# Patient Record
Sex: Male | Born: 1950 | Race: White | Hispanic: No | Smoking: Former smoker
Health system: Southern US, Community
[De-identification: ages and names within clinical notes are randomized; demographics above are authoritative.]

## PROBLEM LIST (undated history)

## (undated) DIAGNOSIS — E785 Hyperlipidemia, unspecified: Secondary | ICD-10-CM

## (undated) DIAGNOSIS — R51 Headache: Secondary | ICD-10-CM

## (undated) DIAGNOSIS — Z87891 Personal history of nicotine dependence: Secondary | ICD-10-CM

## (undated) DIAGNOSIS — K219 Gastro-esophageal reflux disease without esophagitis: Secondary | ICD-10-CM

## (undated) DIAGNOSIS — G4700 Insomnia, unspecified: Secondary | ICD-10-CM

## (undated) DIAGNOSIS — R05 Cough: Secondary | ICD-10-CM

## (undated) DIAGNOSIS — R059 Cough, unspecified: Secondary | ICD-10-CM

## (undated) DIAGNOSIS — G0490 Encephalitis and encephalomyelitis, unspecified: Secondary | ICD-10-CM

## (undated) DIAGNOSIS — R519 Headache, unspecified: Secondary | ICD-10-CM

## (undated) DIAGNOSIS — G20 Parkinson's disease: Secondary | ICD-10-CM

## (undated) DIAGNOSIS — G527 Disorders of multiple cranial nerves: Secondary | ICD-10-CM

## (undated) DIAGNOSIS — E559 Vitamin D deficiency, unspecified: Secondary | ICD-10-CM

## (undated) DIAGNOSIS — M797 Fibromyalgia: Secondary | ICD-10-CM

## (undated) DIAGNOSIS — G039 Meningitis, unspecified: Secondary | ICD-10-CM

## (undated) HISTORY — PX: BACK SURGERY: SHX140

## (undated) HISTORY — DX: Disorders of multiple cranial nerves: G52.7

## (undated) HISTORY — PX: NECK SURGERY: SHX720

## (undated) HISTORY — DX: Vitamin D deficiency, unspecified: E55.9

## (undated) HISTORY — DX: Insomnia, unspecified: G47.00

## (undated) HISTORY — DX: Fibromyalgia: M79.7

## (undated) HISTORY — DX: Encephalitis and encephalomyelitis, unspecified: G04.90

## (undated) HISTORY — DX: Cough, unspecified: R05.9

## (undated) HISTORY — DX: Meningitis, unspecified: G03.9

## (undated) HISTORY — DX: Hyperlipidemia, unspecified: E78.5

## (undated) HISTORY — DX: Parkinson's disease: G20

## (undated) HISTORY — DX: Headache, unspecified: R51.9

## (undated) HISTORY — DX: Headache: R51

## (undated) HISTORY — DX: Cough: R05

## (undated) HISTORY — DX: Gastro-esophageal reflux disease without esophagitis: K21.9

## (undated) HISTORY — DX: Personal history of nicotine dependence: Z87.891

---

## 1999-08-18 ENCOUNTER — Encounter: Admission: RE | Admit: 1999-08-18 | Discharge: 1999-08-18 | Payer: Self-pay | Admitting: Neurosurgery

## 1999-08-18 ENCOUNTER — Encounter: Payer: Self-pay | Admitting: Neurosurgery

## 1999-09-09 ENCOUNTER — Inpatient Hospital Stay (HOSPITAL_COMMUNITY): Admission: RE | Admit: 1999-09-09 | Discharge: 1999-09-14 | Payer: Self-pay | Admitting: Neurosurgery

## 1999-09-09 ENCOUNTER — Encounter: Payer: Self-pay | Admitting: Neurosurgery

## 1999-09-23 ENCOUNTER — Encounter: Payer: Self-pay | Admitting: Neurosurgery

## 1999-09-23 ENCOUNTER — Encounter: Admission: RE | Admit: 1999-09-23 | Discharge: 1999-09-23 | Payer: Self-pay | Admitting: Neurosurgery

## 1999-10-05 DIAGNOSIS — G0490 Encephalitis and encephalomyelitis, unspecified: Secondary | ICD-10-CM

## 1999-10-05 DIAGNOSIS — G039 Meningitis, unspecified: Secondary | ICD-10-CM

## 1999-10-05 HISTORY — DX: Meningitis, unspecified: G03.9

## 1999-10-05 HISTORY — DX: Encephalitis and encephalomyelitis, unspecified: G04.90

## 1999-10-06 ENCOUNTER — Encounter: Payer: Self-pay | Admitting: Neurosurgery

## 1999-10-06 ENCOUNTER — Encounter: Admission: RE | Admit: 1999-10-06 | Discharge: 1999-10-06 | Payer: Self-pay | Admitting: Neurosurgery

## 1999-10-26 ENCOUNTER — Encounter: Admission: RE | Admit: 1999-10-26 | Discharge: 1999-10-26 | Payer: Self-pay | Admitting: Neurosurgery

## 1999-10-26 ENCOUNTER — Encounter: Payer: Self-pay | Admitting: Neurosurgery

## 1999-12-08 ENCOUNTER — Encounter: Payer: Self-pay | Admitting: Neurosurgery

## 1999-12-08 ENCOUNTER — Encounter: Admission: RE | Admit: 1999-12-08 | Discharge: 1999-12-08 | Payer: Self-pay | Admitting: Neurosurgery

## 1999-12-17 ENCOUNTER — Encounter: Payer: Self-pay | Admitting: Neurosurgery

## 1999-12-17 ENCOUNTER — Encounter: Admission: RE | Admit: 1999-12-17 | Discharge: 1999-12-17 | Payer: Self-pay | Admitting: Neurosurgery

## 2000-01-04 ENCOUNTER — Encounter: Payer: Self-pay | Admitting: Neurosurgery

## 2000-01-04 ENCOUNTER — Encounter: Admission: RE | Admit: 2000-01-04 | Discharge: 2000-01-04 | Payer: Self-pay | Admitting: Neurosurgery

## 2001-03-12 ENCOUNTER — Encounter: Payer: Self-pay | Admitting: *Deleted

## 2001-03-12 ENCOUNTER — Encounter: Payer: Self-pay | Admitting: Emergency Medicine

## 2001-03-12 ENCOUNTER — Inpatient Hospital Stay (HOSPITAL_COMMUNITY): Admission: EM | Admit: 2001-03-12 | Discharge: 2001-03-21 | Payer: Self-pay | Admitting: Emergency Medicine

## 2001-03-13 ENCOUNTER — Encounter: Payer: Self-pay | Admitting: Neurology

## 2001-03-14 ENCOUNTER — Encounter: Payer: Self-pay | Admitting: Neurology

## 2001-03-17 ENCOUNTER — Encounter: Payer: Self-pay | Admitting: Neurology

## 2001-03-17 ENCOUNTER — Encounter: Admission: RE | Admit: 2001-03-17 | Discharge: 2001-03-17 | Payer: Self-pay | Admitting: Neurology

## 2001-03-20 ENCOUNTER — Encounter: Payer: Self-pay | Admitting: Infectious Diseases

## 2001-04-13 ENCOUNTER — Encounter: Payer: Self-pay | Admitting: Neurology

## 2001-04-13 ENCOUNTER — Inpatient Hospital Stay (HOSPITAL_COMMUNITY): Admission: EM | Admit: 2001-04-13 | Discharge: 2001-04-14 | Payer: Self-pay | Admitting: Emergency Medicine

## 2001-04-24 ENCOUNTER — Encounter: Admission: RE | Admit: 2001-04-24 | Discharge: 2001-04-24 | Payer: Self-pay | Admitting: Neurology

## 2001-04-24 ENCOUNTER — Encounter: Payer: Self-pay | Admitting: Neurology

## 2001-10-25 ENCOUNTER — Encounter: Payer: Self-pay | Admitting: Pulmonary Disease

## 2001-10-25 ENCOUNTER — Ambulatory Visit (HOSPITAL_COMMUNITY): Admission: RE | Admit: 2001-10-25 | Discharge: 2001-10-25 | Payer: Self-pay | Admitting: Pulmonary Disease

## 2003-02-12 ENCOUNTER — Encounter: Admission: RE | Admit: 2003-02-12 | Discharge: 2003-05-13 | Payer: Self-pay | Admitting: Neurology

## 2003-07-04 ENCOUNTER — Encounter: Payer: Self-pay | Admitting: Cardiology

## 2003-07-04 ENCOUNTER — Encounter: Admission: RE | Admit: 2003-07-04 | Discharge: 2003-07-04 | Payer: Self-pay | Admitting: Cardiology

## 2003-07-24 ENCOUNTER — Ambulatory Visit (HOSPITAL_COMMUNITY): Admission: RE | Admit: 2003-07-24 | Discharge: 2003-07-24 | Payer: Self-pay | Admitting: Cardiology

## 2004-05-14 ENCOUNTER — Ambulatory Visit (HOSPITAL_COMMUNITY): Admission: RE | Admit: 2004-05-14 | Discharge: 2004-05-14 | Payer: Self-pay | Admitting: Neurosurgery

## 2004-05-20 ENCOUNTER — Inpatient Hospital Stay (HOSPITAL_COMMUNITY): Admission: RE | Admit: 2004-05-20 | Discharge: 2004-05-22 | Payer: Self-pay | Admitting: Neurosurgery

## 2004-09-17 ENCOUNTER — Ambulatory Visit: Payer: Self-pay | Admitting: Pulmonary Disease

## 2004-10-16 ENCOUNTER — Ambulatory Visit: Payer: Self-pay | Admitting: Pulmonary Disease

## 2005-01-20 ENCOUNTER — Encounter: Admission: RE | Admit: 2005-01-20 | Discharge: 2005-01-20 | Payer: Self-pay | Admitting: Emergency Medicine

## 2006-02-11 ENCOUNTER — Ambulatory Visit (HOSPITAL_BASED_OUTPATIENT_CLINIC_OR_DEPARTMENT_OTHER): Admission: RE | Admit: 2006-02-11 | Discharge: 2006-02-11 | Payer: Self-pay | Admitting: Urology

## 2011-01-15 ENCOUNTER — Other Ambulatory Visit: Payer: Self-pay | Admitting: Family Medicine

## 2011-01-15 ENCOUNTER — Ambulatory Visit
Admission: RE | Admit: 2011-01-15 | Discharge: 2011-01-15 | Disposition: A | Discharge status: Home / Self Care | Payer: BLUE CROSS/BLUE SHIELD | Source: Ambulatory Visit | Attending: Family Medicine | Admitting: Family Medicine

## 2011-01-15 DIAGNOSIS — R042 Hemoptysis: Secondary | ICD-10-CM

## 2011-01-15 DIAGNOSIS — R0602 Shortness of breath: Secondary | ICD-10-CM

## 2011-01-19 ENCOUNTER — Ambulatory Visit
Admission: RE | Admit: 2011-01-19 | Discharge: 2011-01-19 | Disposition: A | Discharge status: Home / Self Care | Payer: BLUE CROSS/BLUE SHIELD | Source: Ambulatory Visit | Attending: Family Medicine | Admitting: Family Medicine

## 2011-01-19 DIAGNOSIS — R0602 Shortness of breath: Secondary | ICD-10-CM

## 2011-01-19 MED ORDER — IOHEXOL 300 MG/ML  SOLN
75.0000 mL | Freq: Once | INTRAMUSCULAR | Status: AC | PRN
  Administered 2011-01-19: 75 mL via INTRAVENOUS

## 2011-01-22 ENCOUNTER — Other Ambulatory Visit: Payer: BLUE CROSS/BLUE SHIELD

## 2011-02-17 ENCOUNTER — Ambulatory Visit (INDEPENDENT_AMBULATORY_CARE_PROVIDER_SITE_OTHER): Payer: BC Managed Care – PPO | Admitting: Critical Care Medicine

## 2011-02-17 ENCOUNTER — Encounter: Payer: Self-pay | Admitting: Critical Care Medicine

## 2011-02-17 VITALS — BP 128/80 | HR 103 | Temp 98.1°F | Ht 67.0 in | Wt 224.0 lb

## 2011-02-17 DIAGNOSIS — R042 Hemoptysis: Secondary | ICD-10-CM

## 2011-02-17 DIAGNOSIS — R0602 Shortness of breath: Secondary | ICD-10-CM

## 2011-02-17 NOTE — Patient Instructions (Signed)
A CT chest with contrast to evaluate for pulmonary embolism will be obtained.   If this is normal we will need to do a bronchoscopy next week I will call with results

## 2011-02-17 NOTE — Progress Notes (Deleted)
Subjective:     Patient ID: Ronald Delgado, male   DOB: 1951/03/20, 60 y.o.   MRN: [id number]  HPI   Review of Systems  Constitutional: Positive for activity change and fatigue. Negative for fever, chills, diaphoresis, appetite change and unexpected weight change.  HENT: Positive for neck stiffness, postnasal drip and sinus pressure. Negative for hearing loss, ear pain, nosebleeds, congestion, sore throat, facial swelling, rhinorrhea, sneezing, mouth sores, trouble swallowing, neck pain, dental problem, voice change, tinnitus and ear discharge.   Eyes: Negative for photophobia, discharge, itching and visual disturbance.  Respiratory: Positive for cough, shortness of breath and wheezing. Negative for apnea, choking, chest tightness and stridor.   Cardiovascular: Positive for chest pain and palpitations. Negative for leg swelling.  Gastrointestinal: Negative for nausea, vomiting, abdominal pain, constipation, blood in stool and abdominal distention.  Genitourinary: Positive for frequency. Negative for dysuria, urgency, hematuria, flank pain, decreased urine volume and difficulty urinating.  Musculoskeletal: Positive for gait problem. Negative for myalgias, back pain, joint swelling and arthralgias.  Skin: Negative for color change, pallor and rash.  Neurological: Positive for tremors, speech difficulty, weakness and headaches. Negative for dizziness, seizures, syncope, light-headedness and numbness.  Hematological: Negative for adenopathy. Does not bruise/bleed easily.  Psychiatric/Behavioral: Negative for confusion, sleep disturbance and agitation. The patient is not nervous/anxious.        Objective:   Physical Exam     Assessment:     ***    Plan:     ***

## 2011-02-17 NOTE — Progress Notes (Signed)
Subjective:    Patient ID: Ronald Delgado, male    DOB: Feb 18, 1951, 60 y.o.   MRN: [id number] 60 yo WM referred for the following symptoms: Shortness of Breath The current episode started more than 1 month ago. The problem occurs daily (dyspnea on exertion 195ft, worse up incline, occasionally at rest ). The problem has been unchanged. Associated symptoms include hemoptysis, neck pain, rhinorrhea, sputum production and wheezing. Pertinent negatives include no chest pain, claudication, ear pain, fever, headaches, leg pain, leg swelling, orthopnea, PND, rash, sore throat, syncope or vomiting. The symptoms are aggravated by any activity. There is no history of asthma, a heart failure, PE or pneumonia.  Cough This is a new problem. The current episode started more than 1 month ago. The problem has been gradually worsening. The problem occurs every few minutes. The cough is productive of sputum and productive of purulent sputum. Associated symptoms include hemoptysis, postnasal drip, rhinorrhea, shortness of breath and wheezing. Pertinent negatives include no chest pain, chills, ear congestion, ear pain, fever, headaches, myalgias, nasal congestion, rash, sore throat or sweats. Associated symptoms comments: Notes sharp pain LUE lasts a few seconds.  Neck is stiff. There is no history of asthma or pneumonia.    6weeks ago began getting dyspnea, like air forced out of lungs, then hemoptysis.  Pure blood.  Not large amounts, 6 weeks ago, lasted one week. Then resolved.  No real energy issues.  No real chest pain.  Still with dyspnea .     Past Medical History  Diagnosis Date  . Hyperlipidemia   . Chronic headache   . Meningitis 2001    viral  . 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 GERD (gastroesophageal reflux disease)   . Cough      Family History  Problem Relation Age of Onset  . Heart disease Mother   . Breast cancer Mother      History   Social History  . Marital Status: Married   Spouse Name: N/A    Number of Children: 0  . 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 Smoking status: Former Smoker -- 1.0 packs/day for 32 years    Types: Cigarettes    Quit date: 08/16/2000  . Smokeless tobacco: Never Used  . Alcohol Use: No  . Drug Use: No  . Sexually Active: Not on file   Other Topics Concern  . Not on file   Social History Narrative  . No narrative on file     No Known Allergies   No outpatient prescriptions prior to visit.     Review of Systems  Constitutional: Negative for fever and chills.  HENT: Positive for rhinorrhea, neck pain and postnasal drip. Negative for ear pain and sore throat.   Respiratory: Positive for cough, hemoptysis, sputum production, shortness of breath and wheezing.   Cardiovascular: Negative for chest pain, orthopnea, claudication, leg swelling, syncope and PND.  Gastrointestinal: Negative for vomiting.  Musculoskeletal: Negative for myalgias.  Skin: Negative for rash.  Neurological: Negative for headaches.       Objective:   Physical Exam Filed Vitals:   02/17/11 1506  BP: 128/80  Pulse: 103  Temp: 98.1 F (36.7 C)  TempSrc: Oral  Height: 5\' 7"  (1.702 m)  Weight: 224 lb (101.606 kg)  SpO2: 95%    Gen: Pleasant, obese , in no distress,  normal affect  ENT: No lesions,  mouth  clear,  oropharynx clear, no postnasal drip  Neck: No JVD, no TMG, no carotid bruits  Lungs: No use of accessory muscles, no dullness to percussion, clear without rales or rhonchi  Cardiovascular: RRR, heart sounds normal, no murmur or gallops, no peripheral edema  Abdomen: soft and NT, no HSM,  BS normal  Musculoskeletal: No deformities, no cyanosis or clubbing  Neuro: alert, non focal  Skin: Warm, no lesions or rashes     Labs 01/15/11: BNP: <5;  INR 0.98   LFTs normal,  WBC 7.3  Hgb 16  Cr 1.10  CT Chest 4/17: Scarring in LLL,  No infiltrate, no bronchiectasis  or masses  Echo 01/26/11: NL LVEF,  Mild LVH  EF 74% No pulm HTN  Assessment & Plan:   Hemoptysis Hemoptysis with associated dyspnea of ? Etiology. Orig CT chest was not an angio study. Pfts not c/w obstructive lung disease. FeV1 86% Plan CTangio of chest PE protocol If this study is negative, plan FOB No med changes for now    Updated Medication List Outpatient Encounter Prescriptions as of 02/17/2011  Medication Sig Dispense Refill  . famotidine (PEPCID) 20 MG tablet Take 20 mg by mouth 2 (two) times daily.        Marland Kitchen HYDROcodone-acetaminophen (NORCO) 5-325 MG per tablet As needed       . nortriptyline (PAMELOR) 50 MG capsule Take 1 capsule by mouth Daily.      . pregabalin (LYRICA) 75 MG capsule 1 tablet in the morning and 2 tablets in the pm

## 2011-02-18 ENCOUNTER — Ambulatory Visit (INDEPENDENT_AMBULATORY_CARE_PROVIDER_SITE_OTHER)
Admission: RE | Admit: 2011-02-18 | Discharge: 2011-02-18 | Disposition: A | Discharge status: Home / Self Care | Payer: BC Managed Care – PPO | Source: Ambulatory Visit | Attending: Critical Care Medicine | Admitting: Critical Care Medicine

## 2011-02-18 DIAGNOSIS — R0602 Shortness of breath: Secondary | ICD-10-CM

## 2011-02-18 DIAGNOSIS — R042 Hemoptysis: Secondary | ICD-10-CM | POA: Insufficient documentation

## 2011-02-18 MED ORDER — IOHEXOL 300 MG/ML  SOLN
100.0000 mL | Freq: Once | INTRAMUSCULAR | Status: AC | PRN
  Administered 2011-02-18: 100 mL via INTRAVENOUS

## 2011-02-18 NOTE — Assessment & Plan Note (Signed)
Hemoptysis with associated dyspnea of ? Etiology. Orig CT chest was not an angio study. Pfts not c/w obstructive lung disease. FeV1 86% Plan CTangio of chest PE protocol If this study is negative, plan FOB No med changes for now

## 2011-02-24 ENCOUNTER — Encounter: Payer: Self-pay | Admitting: Critical Care Medicine

## 2011-02-24 ENCOUNTER — Other Ambulatory Visit: Payer: Self-pay | Admitting: Critical Care Medicine

## 2011-02-24 ENCOUNTER — Ambulatory Visit (HOSPITAL_COMMUNITY)
Admission: RE | Admit: 2011-02-24 | Discharge: 2011-02-24 | Disposition: A | Discharge status: Home / Self Care | Payer: BC Managed Care – PPO | Source: Ambulatory Visit | Attending: Critical Care Medicine | Admitting: Critical Care Medicine

## 2011-02-24 DIAGNOSIS — J40 Bronchitis, not specified as acute or chronic: Secondary | ICD-10-CM

## 2011-02-24 DIAGNOSIS — R042 Hemoptysis: Secondary | ICD-10-CM

## 2011-02-25 NOTE — Op Note (Signed)
  NAME:  Ronald Delgado, Ronald Delgado            ACCOUNT NO.:  [id number]  MEDICAL RECORD NO.:  [id number]           PATIENT TYPE:  O  LOCATION:  RESPT                        FACILITY:  [facility]  PHYSICIAN:  Charlcie Cradle. Delford Field, MD, FCCPDATE OF BIRTH:  [birth year]  DATE OF PROCEDURE:  02/24/2011 DATE OF DISCHARGE:                              OPERATIVE REPORT   INDICATION:  Hemoptysis.  OPERATOR:  Charlcie Cradle. Delford Field, MD, FCCP  ANESTHESIA:  1% Xylocaine local.  PREMEDICATION: 1. Fentanyl 50 mcg. 2. Versed 5 mg IV push.  PROCEDURE:  The Pentax video bronchoscope was introduced via the right naris.  The upper airways were visualized and unremarkable.  The entire tracheobronchial tree was visualized, revealed diffuse tracheobronchitis with minimal secretions.  No endobronchial lesions were seen.  The right middle lobe was then inspected, showed more prominent secretions in other airways.  This was washed.  No biopsies were taken.  COMPLICATIONS:  None.  IMPRESSION:  Hemoptysis due to tracheobronchitis without endobronchial lesion.  RECOMMENDATION:  Give consideration to inhaled corticosteroids and follow up microbiologic data.     Charlcie Cradle Delford Field, MD, [facility]     PEW/MEDQ  D:  02/24/2011  T:  02/24/2011  Job:  [id number]  Electronically Signed by Shan Levans MD FCCP on 02/25/2011 08:51:09 AM

## 2011-02-26 ENCOUNTER — Telehealth: Payer: Self-pay | Admitting: Critical Care Medicine

## 2011-02-26 DIAGNOSIS — J441 Chronic obstructive pulmonary disease with (acute) exacerbation: Secondary | ICD-10-CM

## 2011-02-26 MED ORDER — BUDESONIDE-FORMOTEROL FUMARATE 160-4.5 MCG/ACT IN AERO: 2.0000 | INHALATION_SPRAY | Freq: Two times a day (BID) | RESPIRATORY_TRACT | Status: DC

## 2011-02-26 NOTE — Telephone Encounter (Signed)
I tried to call the pt at home/work/cell:  No answer I left msg on cell for pt to call back and ask for triage. Pt needs an Rx for symbicort which I did send to his pharmacy pls let him know this when he calls back although I left this info on his phone  His bronch showed inflammation only. Cultures neg, No CA He needs to make another OV soon at [facility] office

## 2011-02-27 LAB — CULTURE, RESPIRATORY W GRAM STAIN

## 2011-03-02 ENCOUNTER — Telehealth: Payer: Self-pay | Admitting: Critical Care Medicine

## 2011-03-02 NOTE — Telephone Encounter (Signed)
See previous phone note. [name], CMA

## 2011-03-02 NOTE — Telephone Encounter (Signed)
Pt called back and states he received PW call and he has an appt to see PW tomorrow. [name], CMA

## 2011-03-03 ENCOUNTER — Ambulatory Visit (INDEPENDENT_AMBULATORY_CARE_PROVIDER_SITE_OTHER): Payer: BC Managed Care – PPO | Admitting: Critical Care Medicine

## 2011-03-03 ENCOUNTER — Encounter: Payer: Self-pay | Admitting: Critical Care Medicine

## 2011-03-03 VITALS — BP 124/88 | HR 99 | Temp 98.6°F | Ht 67.0 in | Wt 220.4 lb

## 2011-03-03 DIAGNOSIS — J42 Unspecified chronic bronchitis: Secondary | ICD-10-CM

## 2011-03-03 DIAGNOSIS — R042 Hemoptysis: Secondary | ICD-10-CM

## 2011-03-03 NOTE — Progress Notes (Signed)
Subjective:    Patient ID: Ronald Delgado, male    DOB: Feb 19, 1951, 60 y.o.   MRN: [id number] 60 yo WM referred for the following symptoms: Shortness of Breath The current episode started more than 1 month ago. The problem occurs daily (dyspnea on exertion 185ft, worse up incline, occasionally at rest ). The problem has been unchanged. Associated symptoms include neck pain, rhinorrhea, sputum production and wheezing. Pertinent negatives include no chest pain, claudication, ear pain, fever, headaches, hemoptysis, leg pain, leg swelling, orthopnea, PND, rash, sore throat, syncope or vomiting. The symptoms are aggravated by any activity. There is no history of asthma, a heart failure, PE or pneumonia.  Cough This is a new problem. The current episode started more than 1 month ago. The problem has been gradually worsening. The problem occurs every few minutes. The cough is productive of sputum and productive of purulent sputum. Associated symptoms include postnasal drip, rhinorrhea, shortness of breath and wheezing. Pertinent negatives include no chest pain, chills, ear congestion, ear pain, fever, headaches, hemoptysis, myalgias, nasal congestion, rash, sore throat or sweats. Associated symptoms comments: Notes sharp pain LUE lasts a few seconds.  Neck is stiff. There is no history of asthma or pneumonia.    6weeks ago began getting dyspnea, like air forced out of lungs, then hemoptysis.  Pure blood.  Not large amounts, 6 weeks ago, lasted one week. Then resolved.  No real energy issues.  No real chest pain.  Still with dyspnea .     03/04/2011 Pt had Fob.  Acute inflammation seen.  No endobronch lesions seen.  All micro neg.  CT Pe study neg.   Pt still dyspneic and coughing. Past Medical History  Diagnosis Date  . Hyperlipidemia   . Chronic headache   . Meningitis 2001    viral  . 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 GERD (gastroesophageal reflux disease)   . Cough      Family History    Problem Relation Age of Onset  . Heart disease Mother   . Breast cancer Mother      History   Social History  . Marital Status: Married    Spouse Name: N/A    Number of Children: 0  . 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 Smoking status: Former Smoker -- 1.0 packs/day for 42 years    Types: Cigarettes    Quit date: 08/16/2000  . Smokeless tobacco: Never Used  . Alcohol Use: No  . Drug Use: No  . Sexually Active: Not on file   Other Topics Concern  . Not on file   Social History Narrative  . No narrative on file     No Known Allergies   Outpatient Prescriptions Prior to Visit  Medication Sig Dispense Refill  . budesonide-formoterol (SYMBICORT) 160-4.5 MCG/ACT inhaler Inhale 2 puffs into the lungs 2 (two) times daily.  1 Inhaler  6  . famotidine (PEPCID) 20 MG tablet Take 20 mg by mouth 2 (two) times daily.        Marland Kitchen HYDROcodone-acetaminophen (NORCO) 5-325 MG per tablet As needed       . nortriptyline (PAMELOR) 50 MG capsule Take 1 capsule by mouth Daily.      . pregabalin (LYRICA) 75 MG capsule 1 tablet in the morning and 2 tablets in the pm          Review of Systems  Constitutional: Negative  for fever and chills.  HENT: Positive for rhinorrhea, neck pain and postnasal drip. Negative for ear pain and sore throat.   Respiratory: Positive for cough, sputum production, shortness of breath and wheezing. Negative for hemoptysis.   Cardiovascular: Negative for chest pain, orthopnea, claudication, leg swelling, syncope and PND.  Gastrointestinal: Negative for vomiting.  Musculoskeletal: Negative for myalgias.  Skin: Negative for rash.  Neurological: Negative for headaches.       Objective:   Physical Exam  Filed Vitals:   03/03/11 1403  BP: 124/88  Pulse: 99  Temp: 98.6 F (37 C)  TempSrc: Oral  Height: 5\' 7"  (1.702 m)  Weight: 220 lb 6.4 oz (99.973 kg)  SpO2: 92%    Gen: Pleasant, obese  , in no distress,  normal affect  ENT: No lesions,  mouth clear,  oropharynx clear, no postnasal drip  Neck: No JVD, no TMG, no carotid bruits  Lungs: No use of accessory muscles, no dullness to percussion, clear without rales or rhonchi  Cardiovascular: RRR, heart sounds normal, no murmur or gallops, no peripheral edema  Abdomen: soft and NT, no HSM,  BS normal  Musculoskeletal: No deformities, no cyanosis or clubbing  Neuro: alert, non focal  Skin: Warm, no lesions or rashes     Labs 01/15/11: BNP: <5;  INR 0.98   LFTs normal,  WBC 7.3  Hgb 16  Cr 1.10  CT Chest 4/17: Scarring in LLL,  No infiltrate, no bronchiectasis or masses  Echo 01/26/11: NL LVEF,  Mild LVH  EF 74% No pulm HTN  Assessment & Plan:   Bronchitis, chronic Hemoptysis d/t chronic bronchitis, spiro without significant airway obstruction but pt remains dyspneic.  No PE seen on CTA of chest.  All c/s data neg from bronch,  No CA on Bronch Plan Start symbicort two puff bid 160    Hemoptysis See bronchitis assessment Hemoptysis has resolved spontaneously     Updated Medication List Outpatient Encounter Prescriptions as of 03/03/2011  Medication Sig Dispense Refill  . budesonide-formoterol (SYMBICORT) 160-4.5 MCG/ACT inhaler Inhale 2 puffs into the lungs 2 (two) times daily.  1 Inhaler  6  . famotidine (PEPCID) 20 MG tablet Take 20 mg by mouth 2 (two) times daily.        Marland Kitchen HYDROcodone-acetaminophen (NORCO) 5-325 MG per tablet As needed       . nortriptyline (PAMELOR) 50 MG capsule Take 1 capsule by mouth Daily.      . pregabalin (LYRICA) 75 MG capsule 1 tablet in the morning and 2 tablets in the pm

## 2011-03-03 NOTE — Patient Instructions (Signed)
Use symbicort two puff twice daily No other med changes Return 3 months

## 2011-03-04 NOTE — Assessment & Plan Note (Signed)
Hemoptysis d/t chronic bronchitis, spiro without significant airway obstruction but pt remains dyspneic.  No PE seen on CTA of chest.  All c/s data neg from bronch,  No CA on Bronch Plan Start symbicort two puff bid 160

## 2011-03-04 NOTE — Assessment & Plan Note (Signed)
See bronchitis assessment Hemoptysis has resolved spontaneously

## 2011-03-24 LAB — FUNGUS CULTURE W SMEAR

## 2011-04-08 LAB — AFB CULTURE WITH SMEAR (NOT AT ARMC)

## 2011-08-03 ENCOUNTER — Encounter: Payer: Self-pay | Admitting: Critical Care Medicine

## 2011-08-03 ENCOUNTER — Ambulatory Visit (INDEPENDENT_AMBULATORY_CARE_PROVIDER_SITE_OTHER): Payer: BC Managed Care – PPO | Admitting: Critical Care Medicine

## 2011-08-03 VITALS — BP 140/88 | HR 113 | Temp 98.6°F | Ht 66.5 in | Wt 228.2 lb

## 2011-08-03 DIAGNOSIS — R042 Hemoptysis: Secondary | ICD-10-CM

## 2011-08-03 DIAGNOSIS — Z23 Encounter for immunization: Secondary | ICD-10-CM

## 2011-08-03 DIAGNOSIS — J42 Unspecified chronic bronchitis: Secondary | ICD-10-CM

## 2011-08-03 MED ORDER — PREDNISONE 10 MG PO TABS: ORAL_TABLET | ORAL | Status: DC

## 2011-08-03 MED ORDER — AZITHROMYCIN 250 MG PO TABS: ORAL_TABLET | ORAL | Status: DC

## 2011-08-03 NOTE — Patient Instructions (Signed)
Start prednisone 10mg  Take 4 for two days three for two days two for two days one for two days Azithromycin 250mg  Take two once then one daily until gone Stay on symbicort Flu vaccine and pneumovax today Return 6 weeks recheck

## 2011-08-03 NOTE — Progress Notes (Signed)
Subjective:    Patient ID: Ronald Delgado, male    DOB: [birth year], 60 y.o.   MRN: [id number] 60 yo WM referred for the following symptoms: HPI  6weeks ago began getting dyspnea, like air forced out of lungs, then hemoptysis.  Pure blood.  Not large amounts, 6 weeks ago, lasted one week. Then resolved.  No real energy issues.  No real chest pain.  Still with dyspnea .     03/03/11 Pt had Fob.  Acute inflammation seen.  No endobronch lesions seen.  All micro neg.  CT Pe study neg.   Pt still dyspneic and coughing.  10/30 Noting more difficulty for past 6weeks.  Notes more dyspnea.  Mucus is sl bloody, also sl green to brown.    Past Medical History  Diagnosis Date  . Hyperlipidemia   . Chronic headache   . Meningitis 2001    viral  . 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 GERD (gastroesophageal reflux disease)   . Cough      Family History  Problem Relation Age of Onset  . Heart disease Mother   . Breast cancer Mother      History   Social History  . Marital Status: Married    Spouse Name: N/A    Number of Children: 0  . 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 Smoking status: Former Smoker -- 1.0 packs/day for 42 years    Types: Cigarettes    Quit date: 08/16/2000  . Smokeless tobacco: Never Used  . Alcohol Use: No  . Drug Use: No  . Sexually Active: Not on file   Other Topics Concern  . Not on file   Social History Narrative  . No narrative on file     No Known Allergies   Outpatient Prescriptions Prior to Visit  Medication Sig Dispense Refill  . budesonide-formoterol (SYMBICORT) 160-4.5 MCG/ACT inhaler Inhale 2 puffs into the lungs 2 (two) times daily.  1 Inhaler  6  . famotidine (PEPCID) 20 MG tablet Take 20 mg by mouth 2 (two) times daily.        Marland Kitchen HYDROcodone-acetaminophen (NORCO) 5-325 MG per tablet As needed       . nortriptyline (PAMELOR) 50 MG capsule Take 1 capsule by mouth Daily.       . pregabalin (LYRICA) 75 MG capsule 1 tablet in the morning and 2 tablets in the pm          Review of Systems Constitutional:   No  weight loss, night sweats,  Fevers, chills, fatigue, lassitude. HEENT:   No headaches,  Difficulty swallowing,  Tooth/dental problems,  Sore throat,                No sneezing, itching, ear ache, nasal congestion, post nasal drip,   CV:  No chest pain,  Orthopnea, PND, swelling in lower extremities, anasarca, dizziness, palpitations  GI  No heartburn, indigestion, abdominal pain, nausea, vomiting, diarrhea, change in bowel habits, loss of appetite  Resp: Notes  shortness of breath with exertion or at rest.  Notes  excess mucus, notes productive cough,  No non-productive cough,  No coughing up of blood.  Notes  change in color of mucus.  No wheezing.  No chest wall deformity  Skin: no rash or lesions.  GU: no dysuria, change in color of urine, no urgency or frequency.  No flank pain.  MS:  No joint pain or swelling.  No decreased range of motion.  No back pain.  Psych:  No change in mood or affect. No depression or anxiety.  No memory loss.     Objective:   Physical Exam  Filed Vitals:   08/03/11 1624  BP: 140/88  Pulse: 113  Temp: 98.6 F (37 C)  TempSrc: Oral  Height: 5' 6.5" (1.689 m)  Weight: 228 lb 3.2 oz (103.511 kg)  SpO2: 91%    Gen: Pleasant, obese , in no distress,  normal affect  ENT: No lesions,  mouth clear,  oropharynx clear, no postnasal drip  Neck: No JVD, no TMG, no carotid bruits  Lungs: No use of accessory muscles, no dullness to percussion, exp wheeze, coarse BS  Cardiovascular: RRR, heart sounds normal, no murmur or gallops, no peripheral edema  Abdomen: soft and NT, no HSM,  BS normal  Musculoskeletal: No deformities, no cyanosis or clubbing  Neuro: alert, non focal  Skin: Warm, no lesions or rashes     Labs 01/15/11: BNP: <5;  INR 0.98   LFTs normal,  WBC 7.3  Hgb 16  Cr 1.10  CT Chest 4/17: Scarring  in LLL,  No infiltrate, no bronchiectasis or masses  Echo 01/26/11: NL LVEF,  Mild LVH  EF 74% No pulm HTN  Assessment & Plan:   Hemoptysis Recurrent hemoptysis on the basis of acute tracheobronchitis, no evidence for malignancy Plan Azithromycin for 5 days Prednisone taper  Bronchitis, chronic Reviewed hemoptysis problem Plan will be to administer azithromycin and prednisone     Updated Medication List Outpatient Encounter Prescriptions as of 08/03/2011  Medication Sig Dispense Refill  . budesonide-formoterol (SYMBICORT) 160-4.5 MCG/ACT inhaler Inhale 2 puffs into the lungs 2 (two) times daily.  1 Inhaler  6  . famotidine (PEPCID) 20 MG tablet Take 20 mg by mouth 2 (two) times daily.        Marland Kitchen HYDROcodone-acetaminophen (NORCO) 5-325 MG per tablet As needed       . nortriptyline (PAMELOR) 50 MG capsule Take 1 capsule by mouth Daily.      . pregabalin (LYRICA) 75 MG capsule 1 tablet in the morning and 2 tablets in the pm       . azithromycin (ZITHROMAX) 250 MG tablet Take two once then one daily until gone  6 each  0  . predniSONE (DELTASONE) 10 MG tablet Take 4 for two days three for two days two for two days one for two days  20 tablet  0

## 2011-08-04 NOTE — Assessment & Plan Note (Addendum)
Recurrent hemoptysis on the basis of acute tracheobronchitis, no evidence for malignancy Plan Azithromycin for 5 days Prednisone taper

## 2011-08-04 NOTE — Assessment & Plan Note (Signed)
Reviewed hemoptysis problem Plan will be to administer azithromycin and prednisone

## 2011-09-14 ENCOUNTER — Encounter: Payer: Self-pay | Admitting: Critical Care Medicine

## 2011-09-14 ENCOUNTER — Ambulatory Visit (INDEPENDENT_AMBULATORY_CARE_PROVIDER_SITE_OTHER): Payer: BC Managed Care – PPO | Admitting: Critical Care Medicine

## 2011-09-14 VITALS — BP 146/92 | HR 99 | Temp 97.9°F | Ht 67.0 in | Wt 227.4 lb

## 2011-09-14 DIAGNOSIS — J42 Unspecified chronic bronchitis: Secondary | ICD-10-CM

## 2011-09-14 DIAGNOSIS — R05 Cough: Secondary | ICD-10-CM

## 2011-09-14 MED ORDER — MOXIFLOXACIN HCL 400 MG PO TABS: 400.0000 mg | ORAL_TABLET | Freq: Every day | ORAL | Status: AC

## 2011-09-14 NOTE — Patient Instructions (Signed)
Take avelox one daily for 5days, use sample, 4 sent to pharmacy Stay on symbicort , inhaler more slowly Return 3 months, sooner if not improving

## 2011-09-14 NOTE — Assessment & Plan Note (Signed)
Recurrent tracheobronchitis and purulent airway secretions Plan avelox x 5days Slow inhaler technique down , stay on symbicort two puff bid

## 2011-09-14 NOTE — Progress Notes (Signed)
Subjective:    Patient ID: Ronald Delgado, male    DOB: [birth year], 60 y.o.   MRN: [id number] 60 yo WM referred for the following symptoms: HPI   6weeks ago began getting dyspnea, like air forced out of lungs, then hemoptysis.  Pure blood.  Not large amounts, 6 weeks ago, lasted one week. Then resolved.  No real energy issues.  No real chest pain.  Still with dyspnea .     03/03/11 Pt had Fob.  Acute inflammation seen.  No endobronch lesions seen.  All micro neg.  CT Pe study neg.   Pt still dyspneic and coughing.  10/30 Noting more difficulty for past 6weeks.  Notes more dyspnea.  Mucus is sl bloody, also sl green to brown.  09/14/2011 At last ov we rec: Azithromycin and pred pulse Not as much volume.  Still brown green.  No real sinus issues.  Notes dyspnea with exertion. By 230pm is wiped out.   Past Medical History  Diagnosis Date  . Hyperlipidemia   . Chronic headache   . Meningitis 2001    viral  . 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 GERD (gastroesophageal reflux disease)   . Cough      Family History  Problem Relation Age of Onset  . Heart disease Mother   . Breast cancer Mother      History   Social History  . Marital Status: Married    Spouse Name: N/A    Number of Children: 0  . 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 Smoking status: Former Smoker -- 1.0 packs/day for 42 years    Types: Cigarettes    Quit date: 08/16/2000  . Smokeless tobacco: Never Used  . Alcohol Use: No  . Drug Use: No  . Sexually Active: Not on file   Other Topics Concern  . Not on file   Social History Narrative  . No narrative on file     No Known Allergies   Outpatient Prescriptions Prior to Visit  Medication Sig Dispense Refill  . budesonide-formoterol (SYMBICORT) 160-4.5 MCG/ACT inhaler Inhale 2 puffs into the lungs 2 (two) times daily.  1 Inhaler  6  . famotidine (PEPCID) 20 MG tablet Take 20 mg by  mouth 2 (two) times daily.        Marland Kitchen HYDROcodone-acetaminophen (NORCO) 5-325 MG per tablet As needed       . nortriptyline (PAMELOR) 50 MG capsule Take 1 capsule by mouth Daily.      . pregabalin (LYRICA) 75 MG capsule 1 tablet in the morning and 2 tablets in the pm       . azithromycin (ZITHROMAX) 250 MG tablet Take two once then one daily until gone  6 each  0  . predniSONE (DELTASONE) 10 MG tablet Take 4 for two days three for two days two for two days one for two days  20 tablet  0     Review of Systems  Constitutional:   No  weight loss, night sweats,  Fevers, chills, fatigue, lassitude. HEENT:   No headaches,  Difficulty swallowing,  Tooth/dental problems,  Sore throat,                No sneezing, itching, ear ache, nasal congestion, post nasal drip,   CV:  No chest pain,  Orthopnea, PND, swelling in lower extremities, anasarca, dizziness, palpitations  GI  No heartburn, indigestion,  abdominal pain, nausea, vomiting, diarrhea, change in bowel habits, loss of appetite  Resp: Notes  shortness of breath with exertion or at rest.  Notes  excess mucus, notes productive cough,  No non-productive cough,  No coughing up of blood.  Notes  change in color of mucus.  No wheezing.  No chest wall deformity  Skin: no rash or lesions.  GU: no dysuria, change in color of urine, no urgency or frequency.  No flank pain.  MS:  No joint pain or swelling.  No decreased range of motion.  No back pain.  Psych:  No change in mood or affect. No depression or anxiety.  No memory loss.     Objective:   Physical Exam  Filed Vitals:   09/14/11 1632  BP: 146/92  Pulse: 99  Temp: 97.9 F (36.6 C)  TempSrc: Oral  Height: 5\' 7"  (1.702 m)  Weight: 227 lb 6.4 oz (103.148 kg)  SpO2: 94%    Gen: Pleasant, obese , in no distress,  normal affect  ENT: No lesions,  mouth clear,  oropharynx clear, no postnasal drip  Neck: No JVD, no TMG, no carotid bruits  Lungs: No use of accessory muscles, no  dullness to percussion, exp wheeze, coarse BS  Cardiovascular: RRR, heart sounds normal, no murmur or gallops, no peripheral edema  Abdomen: soft and NT, no HSM,  BS normal  Musculoskeletal: No deformities, no cyanosis or clubbing  Neuro: alert, non focal  Skin: Warm, no lesions or rashes     Labs 01/15/11: BNP: <5;  INR 0.98   LFTs normal,  WBC 7.3  Hgb 16  Cr 1.10  CT Chest 4/17: Scarring in LLL,  No infiltrate, no bronchiectasis or masses  Echo 01/26/11: NL LVEF,  Mild LVH  EF 74% No pulm HTN   09/14/11 spiro: Normal  Assessment & Plan:   Bronchitis, chronic Recurrent tracheobronchitis and purulent airway secretions Plan avelox x 5days Slow inhaler technique down , stay on symbicort two puff bid      Updated Medication List Outpatient Encounter Prescriptions as of 09/14/2011  Medication Sig Dispense Refill  . budesonide-formoterol (SYMBICORT) 160-4.5 MCG/ACT inhaler Inhale 2 puffs into the lungs 2 (two) times daily.  1 Inhaler  6  . famotidine (PEPCID) 20 MG tablet Take 20 mg by mouth 2 (two) times daily.        Marland Kitchen HYDROcodone-acetaminophen (NORCO) 5-325 MG per tablet As needed       . nortriptyline (PAMELOR) 50 MG capsule Take 1 capsule by mouth Daily.      . pregabalin (LYRICA) 75 MG capsule 1 tablet in the morning and 2 tablets in the pm       . moxifloxacin (AVELOX) 400 MG tablet Take 1 tablet (400 mg total) by mouth daily.  4 tablet  0  . DISCONTD: azithromycin (ZITHROMAX) 250 MG tablet Take two once then one daily until gone  6 each  0  . DISCONTD: predniSONE (DELTASONE) 10 MG tablet Take 4 for two days three for two days two for two days one for two days  20 tablet  0

## 2011-10-05 DIAGNOSIS — G20C Parkinsonism, unspecified: Secondary | ICD-10-CM

## 2011-10-05 DIAGNOSIS — G20 Parkinson's disease: Secondary | ICD-10-CM

## 2011-10-05 HISTORY — DX: Parkinsonism, unspecified: G20.C

## 2011-10-05 HISTORY — DX: Parkinson's disease: G20

## 2011-11-23 IMAGING — CT CT ANGIO CHEST
2 of 6 series · 19 of 36 positions shown · IV contrast (Omnipaque 300)
Comparison: 01/19/2011.

CLINICAL DATA: Short of breath.  Hemoptysis.  Pulmonary embolism.

CT ANGIOGRAPHY CHEST WITH CONTRAST
TECHNIQUE: Multidetector CT imaging of the chest was performed
using the standard protocol during bolus administration of
intravenous contrast.  Multiplanar CT image reconstructions
including MIPs were obtained to evaluate the vascular anatomy.
Contrast:  100 ml [id number]-PZZ.

[Series 5: thins (id) / (id) · axial · 0.75mm/px · z∈[-263,-23]mm · 18 of 268 slices shown]
[im 14/268  lung]
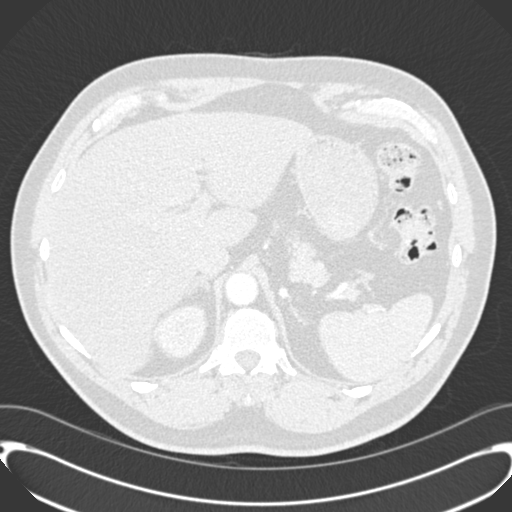
[im 27/268  mediastinal]
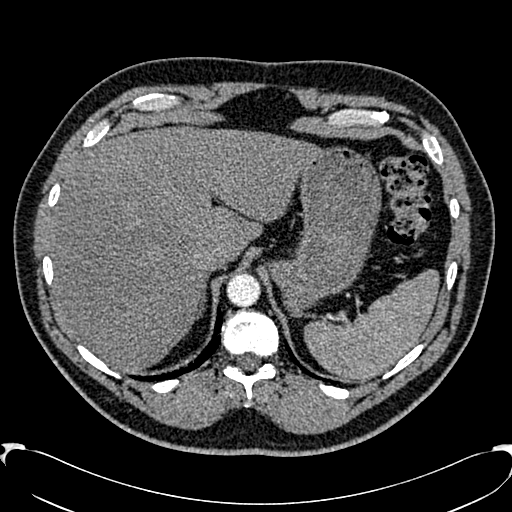
[im 41/268  lung]
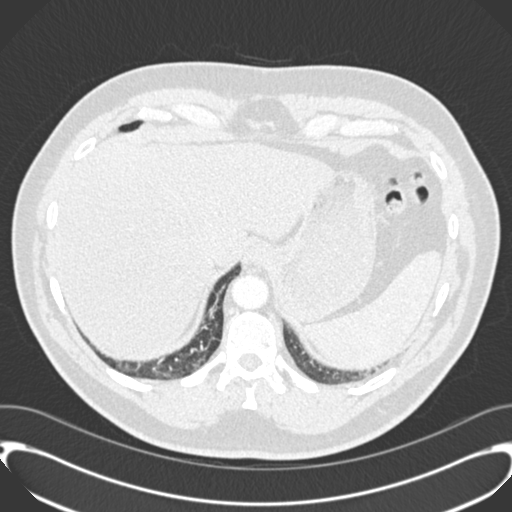
[im 54/268  mediastinal]
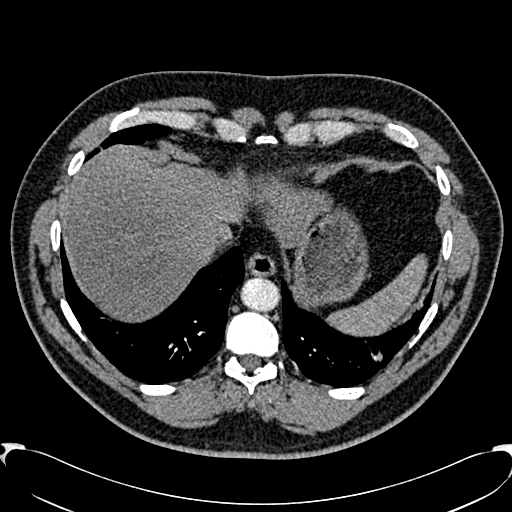
[im 67/268  lung]
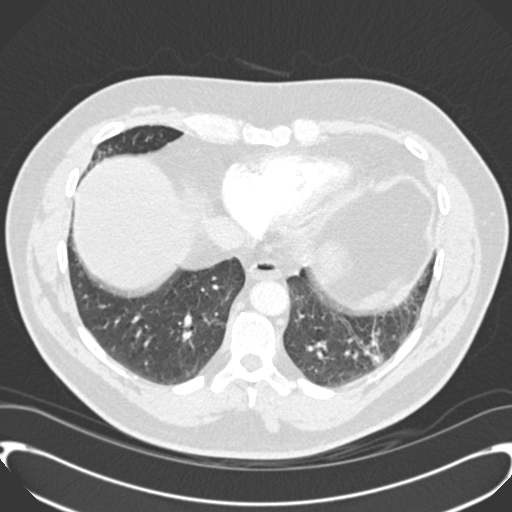
[im 81/268  mediastinal]
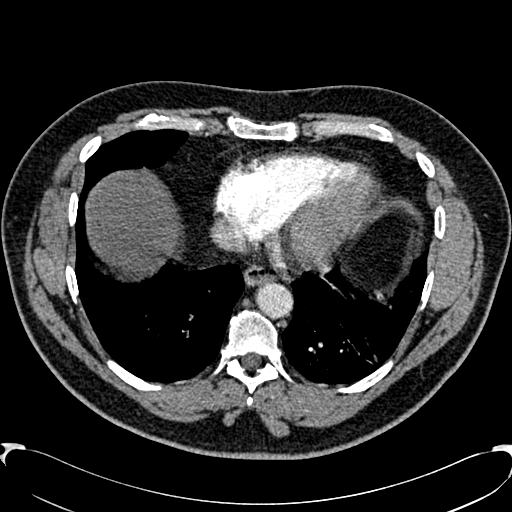
[im 94/268  lung]
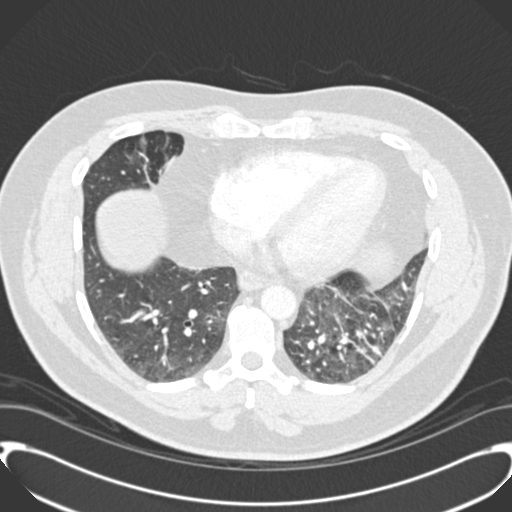
[im 107/268  mediastinal]
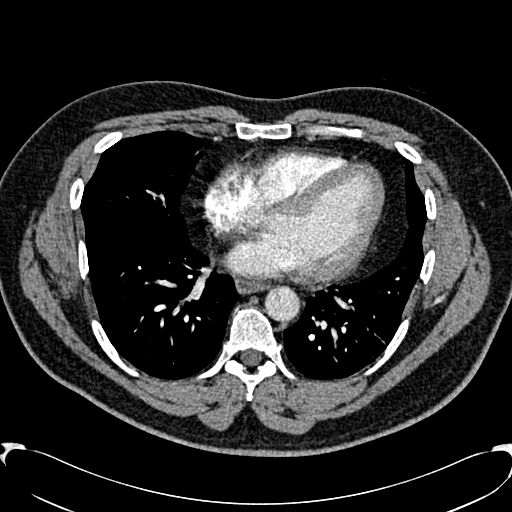
[im 121/268  lung]
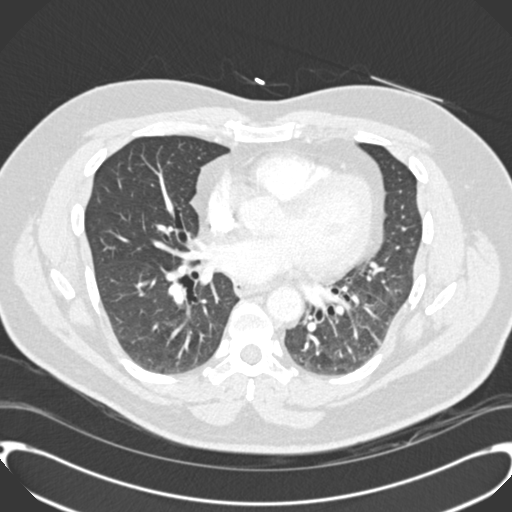
[im 147/268  mediastinal]
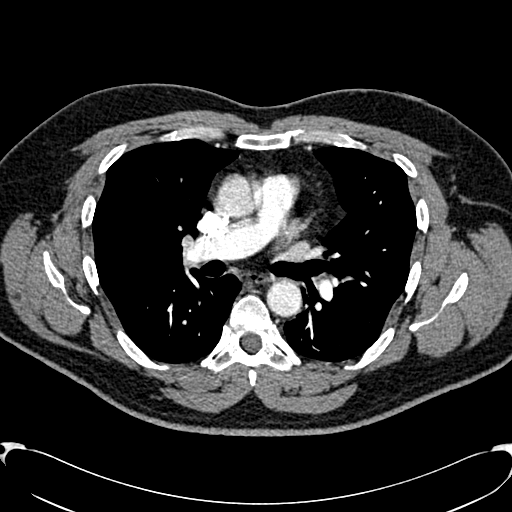
[im 161/268  lung]
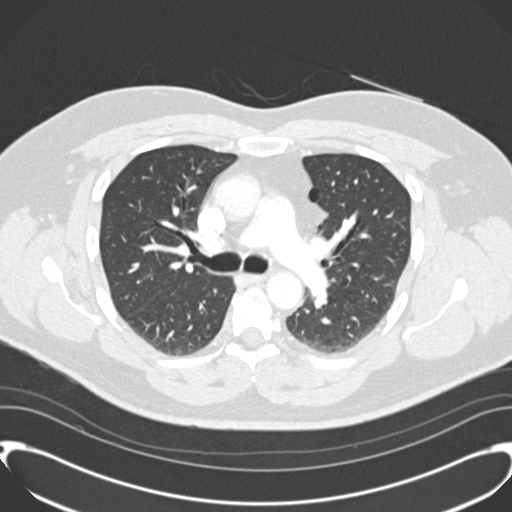
[im 174/268  mediastinal]
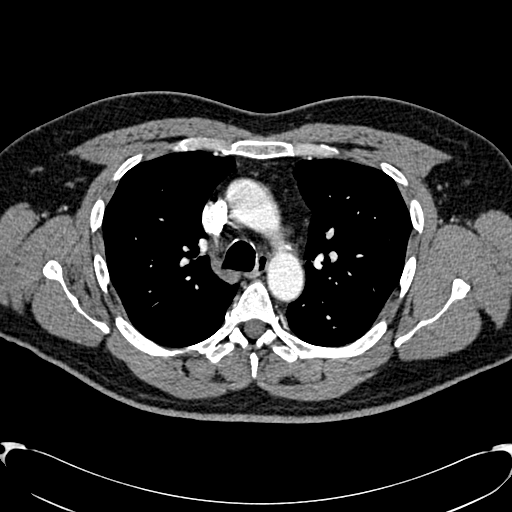
[im 187/268  lung]
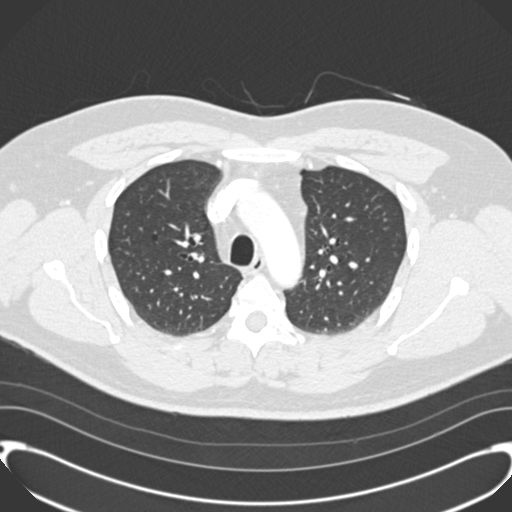
[im 201/268  mediastinal]
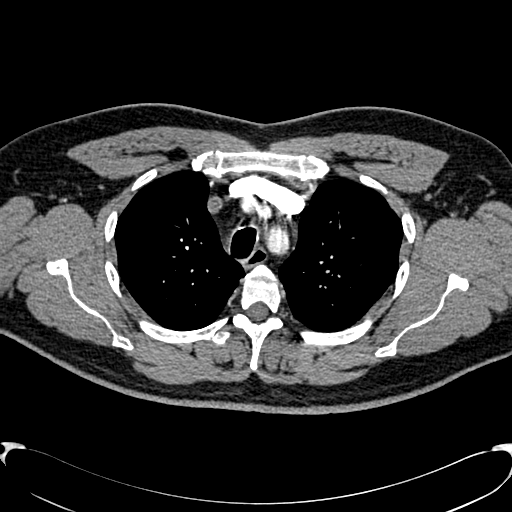
[im 214/268  lung]
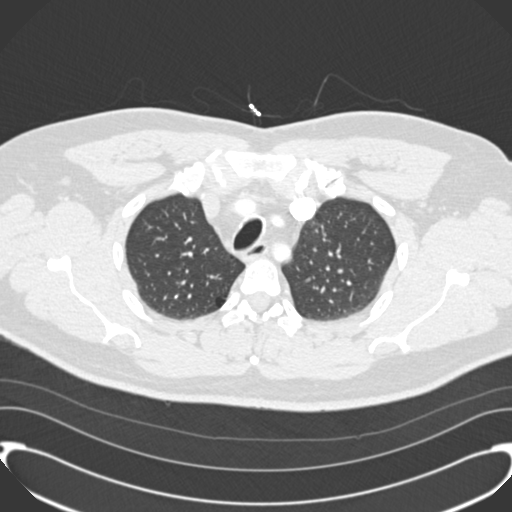
[im 227/268  mediastinal]
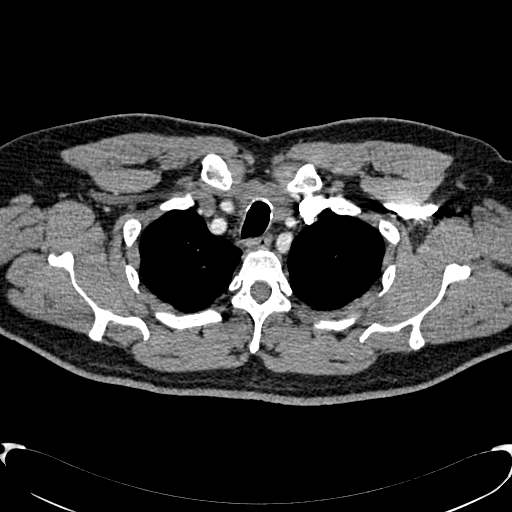
[im 241/268  lung]
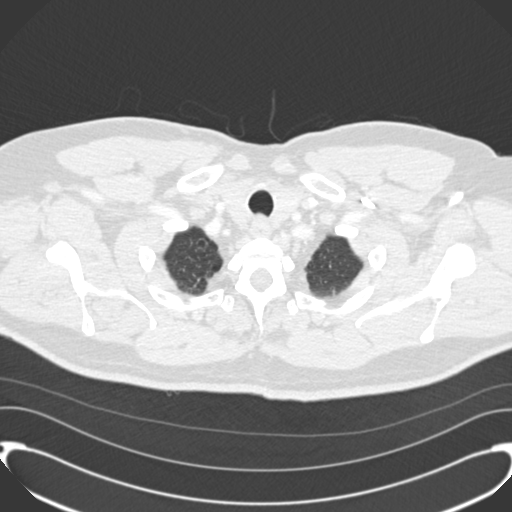
[im 254/268  mediastinal]
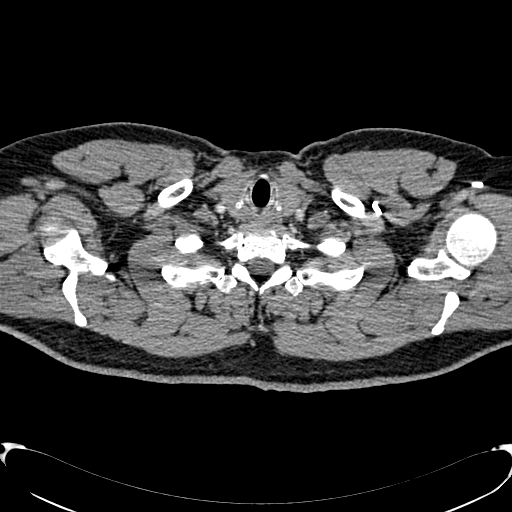

[Series 602: cor mpr · coronal · 0.75mm/px · 1 of 98 slices shown]
[im 49/98  mediastinal]
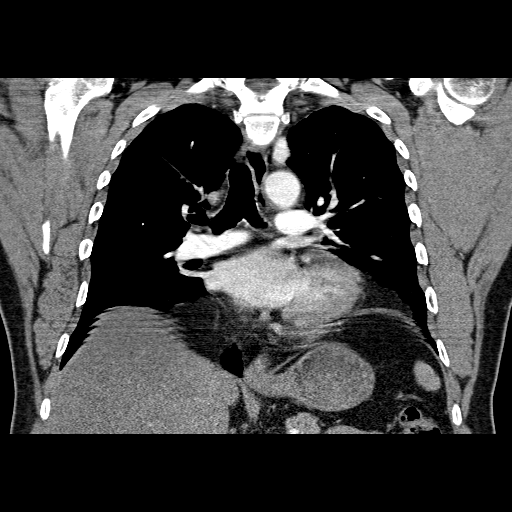

[19 of 36 positions shown; findings below may reference images not displayed]

FINDINGS: The study is technically adequate to evaluate for
pulmonary embolus.  No pulmonary embolism is present.  Mild
respiratory motion artifact is present.  Linear scarring in the
posterior left lower lobe appears unchanged compared to prior.
Mild dependent atelectasis.  Right middle lobe atelectasis and / or
scarring.  There is no axillary adenopathy.  Probable fatty liver.
Three-vessel aortic arch without acute abnormality.  No mediastinal
or hilar adenopathy.  No pericardial or pleural effusion.  Bones
appear within normal limits. Mild paraseptal emphysema is present
at the apices.

Review of the MIP images confirms the above findings.
IMPRESSION: 1.  Technically adequate study without pulmonary embolus.
2.  Scattered areas of atelectasis and pulmonary parenchymal
scarring without active cardiopulmonary disease.
3.  Little interval change compared to prior exam 01/19/2011.
[DATE].  Hepatic steatosis.

## 2012-01-19 DIAGNOSIS — R251 Tremor, unspecified: Secondary | ICD-10-CM | POA: Insufficient documentation

## 2012-06-26 ENCOUNTER — Ambulatory Visit (INDEPENDENT_AMBULATORY_CARE_PROVIDER_SITE_OTHER): Payer: BC Managed Care – PPO | Admitting: Adult Health

## 2012-06-26 ENCOUNTER — Ambulatory Visit (INDEPENDENT_AMBULATORY_CARE_PROVIDER_SITE_OTHER)
Admission: RE | Admit: 2012-06-26 | Discharge: 2012-06-26 | Disposition: A | Discharge status: Home / Self Care | Payer: BC Managed Care – PPO | Source: Ambulatory Visit | Attending: Adult Health | Admitting: Adult Health

## 2012-06-26 ENCOUNTER — Encounter: Payer: Self-pay | Admitting: Adult Health

## 2012-06-26 VITALS — BP 152/86 | HR 101 | Temp 97.0°F | Ht 67.0 in | Wt 222.4 lb

## 2012-06-26 DIAGNOSIS — J42 Unspecified chronic bronchitis: Secondary | ICD-10-CM

## 2012-06-26 DIAGNOSIS — Z23 Encounter for immunization: Secondary | ICD-10-CM

## 2012-06-26 MED ORDER — AMOXICILLIN-POT CLAVULANATE 875-125 MG PO TABS: 1.0000 | ORAL_TABLET | Freq: Two times a day (BID) | ORAL | Status: AC

## 2012-06-26 NOTE — Patient Instructions (Addendum)
Augmentin 875mg  Twice daily for 7 days  Mucinex DM Twice daily  As needed  Cough/congestioin  Fluids and rest  I will call with xray results.  Please contact office for sooner follow up if symptoms do not improve or worsen or seek emergency care  Flu shot

## 2012-06-26 NOTE — Progress Notes (Signed)
Subjective:    Patient ID: Ronald Delgado, male    DOB: 08/29/1951, 61 y.o.   MRN: [id number]   HPI 61 yo WM referred for the following symptoms 6weeks ago began getting dyspnea, like air forced out of lungs, then hemoptysis.  Pure blood.  Not large amounts, 6 weeks ago, lasted one week. Then resolved.  No real energy issues.  No real chest pain.  Still with dyspnea .     03/03/11 Pt had Fob.  Acute inflammation seen.  No endobronch lesions seen.  All micro neg.  CT Pe study neg.  Pt still dyspneic and coughing.  10/30 Noting more difficulty for past 6weeks.  Notes more dyspnea.  Mucus is sl bloody, also sl green to brown.  09/14/11  At last ov we rec: Azithromycin and pred pulse Not as much volume.  Still brown green.  No real sinus issues.  Notes dyspnea with exertion. By 230pm is wiped out.  >avelox and symbicort   06/26/2012 Acute OV  Complains of 5 days of nasal congestion, sore throat , drainage and cough w/ yellow/green mucus, increased SOB, tightness in chest . Felt  worse today  Did have some specks of blood during coughing episode.  No fever or chest pain . No edema.  OTC mucinex not helping  Past Medical History  Diagnosis Date  . Hyperlipidemia   . Chronic headache   . Meningitis 2001    viral  . 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 GERD (gastroesophageal reflux disease)   . Cough      Family History  Problem Relation Age of Onset  . Heart disease Mother   . Breast cancer Mother      History   Social History  . Marital Status: Married    Spouse Name: N/A    Number of Children: 0  . 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 Smoking status: Former Smoker -- 1.0 packs/day for 42 years    Types: Cigarettes    Quit date: 08/16/2000  . Smokeless tobacco: Never Used  . Alcohol Use: No  . Drug Use: No  . Sexually Active: Not on file   Other Topics Concern  . Not on file   Social History  Narrative  . No narrative on file     No Known Allergies   Outpatient Prescriptions Prior to Visit  Medication Sig Dispense Refill  . famotidine (PEPCID) 20 MG tablet Take 20 mg by mouth 2 (two) times daily.        Marland Kitchen HYDROcodone-acetaminophen (NORCO) 5-325 MG per tablet As needed       . nortriptyline (PAMELOR) 50 MG capsule Take 1 capsule by mouth Daily.      . pregabalin (LYRICA) 75 MG capsule 1 tablet in the morning and 2 tablets in the pm       . budesonide-formoterol (SYMBICORT) 160-4.5 MCG/ACT inhaler Inhale 2 puffs into the lungs 2 (two) times daily.  1 Inhaler  6     Review of Systems  Constitutional:   No  weight loss, night sweats,  Fevers, chills, + fatigue, lassitude. HEENT:   No headaches,  Difficulty swallowing,  Tooth/dental problems,  Sore throat,                No sneezing, itching, ear ache,  +nasal congestion, post nasal drip,   CV:  No chest pain,  Orthopnea, PND, swelling  in lower extremities, anasarca, dizziness, palpitations  GI  No heartburn, indigestion, abdominal pain, nausea, vomiting, diarrhea, change in bowel habits, loss of appetite  Resp:  Notes  change in color of mucus.  No wheezing.  No chest wall deformity  Skin: no rash or lesions.  GU: no dysuria, change in color of urine, no urgency or frequency.  No flank pain.  MS:  No joint pain or swelling.  No decreased range of motion.     Psych:  No change in mood or affect. No depression or anxiety.  No memory loss.     Objective:   Physical Exam  Filed Vitals:   06/26/12 1457  BP: 152/86  Pulse: 101  Temp: 97 F (36.1 C)  TempSrc: Oral  Height: 5\' 7"  (1.702 m)  Weight: 222 lb 6.4 oz (100.88 kg)  SpO2: 95%    Gen: Pleasant, obese , in no distress,  normal affect  ENT: No lesions,  mouth clear,  oropharynx clear, no postnasal drip  Neck: No JVD, no TMG, no carotid bruits  Lungs: No use of accessory muscles, no dullness to percussion  coarse BS  Cardiovascular: RRR, heart sounds  normal, no murmur or gallops, no peripheral edema  Abdomen: soft and NT, no HSM,  BS normal  Musculoskeletal: No deformities, no cyanosis or clubbing  Neuro: alert, non focal  Skin: Warm, no lesions or rashes     Labs 01/15/11: BNP: <5;  INR 0.98   LFTs normal,  WBC 7.3  Hgb 16  Cr 1.10  CT Chest 01/19/11 : Scarring in LLL,  No infiltrate, no bronchiectasis or masses  Echo 01/26/11: NL LVEF,  Mild LVH  EF 74% No pulm HTN   09/14/11 spiro: Normal  Assessment & Plan:   No problem-specific assessment & plan notes found for this encounter.   Updated Medication List Outpatient Encounter Prescriptions as of 06/26/2012  Medication Sig Dispense Refill  . budesonide-formoterol (SYMBICORT) 160-4.5 MCG/ACT inhaler Inhale 2 puffs into the lungs 2 (two) times daily.      . famotidine (PEPCID) 20 MG tablet Take 20 mg by mouth 2 (two) times daily.        Marland Kitchen HYDROcodone-acetaminophen (NORCO) 5-325 MG per tablet As needed       . nortriptyline (PAMELOR) 50 MG capsule Take 1 capsule by mouth Daily.      . pregabalin (LYRICA) 75 MG capsule 1 tablet in the morning and 2 tablets in the pm       . DISCONTD: budesonide-formoterol (SYMBICORT) 160-4.5 MCG/ACT inhaler Inhale 2 puffs into the lungs 2 (two) times daily.  1 Inhaler  6

## 2012-06-26 NOTE — Assessment & Plan Note (Signed)
Flare  Check xray today   Plan Augmentin 875mg  Twice daily for 7 days  Mucinex DM Twice daily  As needed  Cough/congestioin  Fluids and rest  I will call with xray results.  Please contact office for sooner follow up if symptoms do not improve or worsen or seek emergency care  Flu shot

## 2012-06-28 ENCOUNTER — Encounter: Payer: Self-pay | Admitting: *Deleted

## 2012-06-28 NOTE — Progress Notes (Signed)
Quick Note:  Pt aware via MYCHART. ______

## 2012-12-27 ENCOUNTER — Ambulatory Visit: Payer: BC Managed Care – PPO | Admitting: Critical Care Medicine

## 2013-01-01 ENCOUNTER — Ambulatory Visit (INDEPENDENT_AMBULATORY_CARE_PROVIDER_SITE_OTHER): Payer: BC Managed Care – PPO | Admitting: Critical Care Medicine

## 2013-01-01 ENCOUNTER — Ambulatory Visit (HOSPITAL_BASED_OUTPATIENT_CLINIC_OR_DEPARTMENT_OTHER)
Admission: RE | Admit: 2013-01-01 | Discharge: 2013-01-01 | Disposition: A | Discharge status: Home / Self Care | Payer: BC Managed Care – PPO | Source: Ambulatory Visit | Attending: Critical Care Medicine | Admitting: Critical Care Medicine

## 2013-01-01 ENCOUNTER — Encounter: Payer: Self-pay | Admitting: Critical Care Medicine

## 2013-01-01 VITALS — BP 116/86 | HR 88 | Temp 98.2°F | Ht 67.0 in | Wt 222.0 lb

## 2013-01-01 DIAGNOSIS — J209 Acute bronchitis, unspecified: Secondary | ICD-10-CM

## 2013-01-01 DIAGNOSIS — K219 Gastro-esophageal reflux disease without esophagitis: Secondary | ICD-10-CM | POA: Insufficient documentation

## 2013-01-01 DIAGNOSIS — E785 Hyperlipidemia, unspecified: Secondary | ICD-10-CM | POA: Insufficient documentation

## 2013-01-01 DIAGNOSIS — G20 Parkinson's disease: Secondary | ICD-10-CM | POA: Insufficient documentation

## 2013-01-01 DIAGNOSIS — G8929 Other chronic pain: Secondary | ICD-10-CM | POA: Insufficient documentation

## 2013-01-01 MED ORDER — AMOXICILLIN-POT CLAVULANATE 875-125 MG PO TABS: 1.0000 | ORAL_TABLET | Freq: Two times a day (BID) | ORAL | Status: DC

## 2013-01-01 NOTE — Progress Notes (Signed)
Subjective:    Patient ID: Ronald Delgado, male    DOB: [birth year], 62 y.o.   MRN: [id number]  HPI 61 y.o.M  Over last 60days more dyspneic.  Notes more cough, yellow to brown. Streaks of blood in the mucus. Last exacerbation was 06/2012.  No real chest pain  Past Medical History  Diagnosis Date  . Hyperlipidemia   . Chronic headache   . Meningitis 2001    viral  . 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 GERD (gastroesophageal reflux disease)   . Cough   . Parkinsonism 2013    tremors  [name]     Family History  Problem Relation Age of Onset  . Heart disease Mother   . Breast cancer Mother      History   Social History  . Marital Status: Widowed    Spouse Name: N/A    Number of Children: 0  . 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 Smoking status: Former Smoker -- 1.00 packs/day for 42 years    Types: Cigarettes    Quit date: 08/16/2000  . Smokeless tobacco: Never Used  . Alcohol Use: No  . Drug Use: No  . Sexually Active: Not on file   Other Topics Concern  . Not on file   Social History Narrative  . No narrative on file     No Known Allergies   Outpatient Prescriptions Prior to Visit  Medication Sig Dispense Refill  . budesonide-formoterol (SYMBICORT) 160-4.5 MCG/ACT inhaler Inhale 2 puffs into the lungs 2 (two) times daily. Most days      . famotidine (PEPCID) 20 MG tablet Take 20 mg by mouth 2 (two) times daily.        Marland Kitchen HYDROcodone-acetaminophen (NORCO) 5-325 MG per tablet As needed       . nortriptyline (PAMELOR) 50 MG capsule Take 1 capsule by mouth Daily.      . pregabalin (LYRICA) 75 MG capsule 1 tablet in the morning and 2 tablets in the pm        No facility-administered medications prior to visit.      Review of Systems Constitutional:   No  weight loss, night sweats,  Fevers, chills, fatigue, lassitude. HEENT:   No headaches,  Difficulty swallowing,  Tooth/dental  problems,  Sore throat,                No sneezing, itching, ear ache,+ nasal congestion, +++post nasal drip,   CV:  No chest pain,  Orthopnea, PND, swelling in lower extremities, anasarca, ++ dizziness, palpitations  GI  No heartburn, indigestion, abdominal pain, nausea, vomiting, diarrhea, change in bowel habits, loss of appetite  Resp: +++ shortness of breath with exertion occ at rest.  Notes  excess mucus, notes  productive cough,  No non-productive cough,  Notes coughing up of blood.  Notes change in color of mucus.  No wheezing.  No chest wall deformity  Skin: no rash or lesions.  GU: no dysuria, change in color of urine, no urgency or frequency.  No flank pain.  MS:  No joint pain or swelling.  No decreased range of motion.  No back pain.  Psych:  No change in mood or affect. No depression or anxiety.  No memory loss.     Objective:   Physical Exam  Filed Vitals:   01/01/13 1024  BP: 116/86  Pulse: 88  Temp:  98.2 F (36.8 C)  TempSrc: Oral  Height: 5\' 7"  (1.702 m)  Weight: 222 lb (100.699 kg)  SpO2: 96%    Gen: Pleasant, well-nourished, in no distress,  normal affect  ENT: No lesions,  mouth clear,  oropharynx clear, no postnasal drip  Neck: No JVD, no TMG, no carotid bruits  Lungs: No use of accessory muscles, no dullness to percussion, clear without rales or rhonchi  Cardiovascular: RRR, heart sounds normal, no murmur or gallops, no peripheral edema  Abdomen: soft and NT, no HSM,  BS normal  Musculoskeletal: No deformities, no cyanosis or clubbing  Neuro: alert, non focal  Skin: Warm, no lesions or rashes  No results found.       Assessment & Plan:   Bronchitis, chronic Recurrent acute tracheobronchitis with mild hemoptysis and note negative chest x-ray Plan Augmentin one twice daily x 7 days Use mucinex DM 1-2 times daily as needed for mucus/cough No other medication changes Return 4 months    Updated Medication List Outpatient Encounter  Prescriptions as of 01/01/2013  Medication Sig Dispense Refill  . aspirin 81 MG tablet Take 81 mg by mouth daily.      Marland Kitchen atorvastatin (LIPITOR) 20 MG tablet Take 1 tablet by mouth daily.      . budesonide-formoterol (SYMBICORT) 160-4.5 MCG/ACT inhaler Inhale 2 puffs into the lungs 2 (two) times daily. Most days      . famotidine (PEPCID) 20 MG tablet Take 20 mg by mouth 2 (two) times daily.        . hydrochlorothiazide (HYDRODIURIL) 25 MG tablet Take 1 tablet by mouth daily.      Marland Kitchen HYDROcodone-acetaminophen (NORCO) 5-325 MG per tablet As needed       . nortriptyline (PAMELOR) 50 MG capsule Take 1 capsule by mouth Daily.      . pregabalin (LYRICA) 75 MG capsule 1 tablet in the morning and 2 tablets in the pm       . amoxicillin-clavulanate (AUGMENTIN) 875-125 MG per tablet Take 1 tablet by mouth 2 (two) times daily.  14 tablet  0   No facility-administered encounter medications on file as of 01/01/2013.

## 2013-01-01 NOTE — Patient Instructions (Addendum)
Augmentin one twice daily x 7 days Use mucinex DM 1-2 times daily as needed for mucus/cough No other medication changes Return 4 months

## 2013-01-01 NOTE — Assessment & Plan Note (Signed)
Recurrent acute tracheobronchitis with mild hemoptysis and note negative chest x-ray Plan Augmentin one twice daily x 7 days Use mucinex DM 1-2 times daily as needed for mucus/cough No other medication changes Return 4 months

## 2013-01-09 ENCOUNTER — Encounter: Payer: Self-pay | Admitting: Adult Health

## 2013-01-09 ENCOUNTER — Ambulatory Visit (INDEPENDENT_AMBULATORY_CARE_PROVIDER_SITE_OTHER): Payer: BC Managed Care – PPO | Admitting: Adult Health

## 2013-01-09 VITALS — BP 116/86 | HR 106 | Temp 97.0°F | Ht 67.0 in | Wt 228.2 lb

## 2013-01-09 DIAGNOSIS — J42 Unspecified chronic bronchitis: Secondary | ICD-10-CM

## 2013-01-09 MED ORDER — AMOXICILLIN-POT CLAVULANATE 875-125 MG PO TABS: 1.0000 | ORAL_TABLET | Freq: Two times a day (BID) | ORAL | Status: AC

## 2013-01-09 MED ORDER — HYDROCODONE-HOMATROPINE 5-1.5 MG/5ML PO SYRP: 5.0000 mL | ORAL_SOLUTION | Freq: Four times a day (QID) | ORAL | Status: DC | PRN

## 2013-01-09 MED ORDER — PREDNISONE 10 MG PO TABS: ORAL_TABLET | ORAL | Status: DC

## 2013-01-09 NOTE — Assessment & Plan Note (Addendum)
Slow to resolve flare  cxr last week with no acute process   Plan  Extend Augmentin 875mg  Twice daily  For 3 days  Prednisone taper over next week.  Mucinex DM Twice daily As needed  Cough/congestion  Hydromet 1-2 tsp every 4-6 hr As needed  Cough ,  May make you sleepy.  Please contact office for sooner follow up if symptoms do not improve or worsen or seek emergency care  Follow up Dr. Delford Field  In 6 weeks and As needed

## 2013-01-09 NOTE — Progress Notes (Signed)
  Subjective:    Patient ID: Ronald Delgado, male    DOB: 10/23/1950, 62 y.o.   MRN: [id number]  HPI 62 yo male with hx of chronic bronchitis , former smoker   01/09/2013 Acute OV  Complains of increased SOB, wheezing, prod cough with green/bloody mucus - finished augmentin yesterday.  symptoms worsened 5 days ago.   Seen 1 week ago with acute bronchitis , tx w/ augmentin  CXR w/ NAD .  Still coughing up thick green mucus. Sinus congestion  No orthopnea, chest pain, edema , calf pain or fever   Review of Systems Constitutional:   No  weight loss, night sweats,  Fevers, chills, fatigue, or  lassitude.  HEENT:   No headaches,  Difficulty swallowing,  Tooth/dental problems, or  Sore throat,                No sneezing, itching, ear ache, + nasal congestion, post nasal drip,   CV:  No chest pain,  Orthopnea, PND, swelling in lower extremities, anasarca, dizziness, palpitations, syncope.   GI  No heartburn, indigestion, abdominal pain, nausea, vomiting, diarrhea, change in bowel habits, loss of appetite, bloody stools.   Resp:    No chest wall deformity  Skin: no rash or lesions.  GU: no dysuria, change in color of urine, no urgency or frequency.  No flank pain, no hematuria   MS:  No joint pain or swelling.  No decreased range of motion.  No back pain.  Psych:  No change in mood or affect. No depression or anxiety.  No memory loss.         Objective:   Physical Exam GEN: A/Ox3; pleasant , NAD,obese    HEENT:  [place]/AT,  EACs-clear, TMs-wnl, NOSE-clear, THROAT-clear, no lesions, no postnasal drip or exudate noted.   NECK:  Supple w/ fair ROM; no JVD; normal carotid impulses w/o bruits; no thyromegaly or nodules palpated; no lymphadenopathy.  RESP  Exp wheezing , few rhonchi no accessory muscle use, no dullness to percussion  CARD:  RRR, no m/r/g  , no peripheral edema, pulses intact, no cyanosis or clubbing.  GI:   Soft & nt; nml bowel sounds; no organomegaly or masses  detected.  Musco: Warm bil, no deformities or joint swelling noted.   Neuro: alert, no focal deficits noted.    Skin: Warm, no lesions or rashes         Assessment & Plan:

## 2013-01-09 NOTE — Patient Instructions (Addendum)
Extend Augmentin 875mg  Twice daily  For 3 days  Prednisone taper over next week.  Mucinex DM Twice daily As needed  Cough/congestion  Hydromet 1-2 tsp every 4-6 hr As needed  Cough ,  May make you sleepy.  Please contact office for sooner follow up if symptoms do not improve or worsen or seek emergency care  Follow up Dr. Delford Field  In 6 weeks and As needed

## 2013-01-17 ENCOUNTER — Encounter: Payer: Self-pay | Admitting: Pulmonary Disease

## 2013-01-17 ENCOUNTER — Ambulatory Visit (INDEPENDENT_AMBULATORY_CARE_PROVIDER_SITE_OTHER)
Admission: RE | Admit: 2013-01-17 | Discharge: 2013-01-17 | Disposition: A | Discharge status: Home / Self Care | Payer: BC Managed Care – PPO | Source: Ambulatory Visit | Attending: Pulmonary Disease | Admitting: Pulmonary Disease

## 2013-01-17 ENCOUNTER — Ambulatory Visit (INDEPENDENT_AMBULATORY_CARE_PROVIDER_SITE_OTHER): Payer: BC Managed Care – PPO | Admitting: Pulmonary Disease

## 2013-01-17 VITALS — BP 132/94 | HR 97 | Temp 97.6°F | Ht 67.0 in | Wt 225.8 lb

## 2013-01-17 DIAGNOSIS — R05 Cough: Secondary | ICD-10-CM

## 2013-01-17 DIAGNOSIS — R058 Other specified cough: Secondary | ICD-10-CM | POA: Insufficient documentation

## 2013-01-17 NOTE — Assessment & Plan Note (Signed)
The patient continues to have cough with green mucus as well as malaise and shortness of breath, but his lungs were totally clear to auscultation.  I am wondering if this is more of a sinusitis, which will require a longer course of antibiotic therapy.  The other possibility is that Augmentin may not be covering his offending organism.  At this point, I would like to check a CT of his sinuses, and if abnormal, he will need a more prolonged course of antibiotics.  If it is clear, will have to decide about changing his antibiotic therapy for 5 additional days to cover possible bronchitis

## 2013-01-17 NOTE — Progress Notes (Signed)
  Subjective:    Patient ID: Ronald Delgado, male    DOB: 08/27/1951, 62 y.o.   MRN: [id number]  HPI The patient comes in today for an acute sick visit.  He has a history of chronic bronchitis with normal spirometry, and has had a three-week history of cough with purulent mucus, as well as flecks of blood at times.  He had chest congestion initially with increasing shortness of breath, and was seen by Dr. Delford Field 3 weeks ago where he was given Augmentin for one week.  He did not improve, and saw our nurse practitioner a week ago where his Augmentin was extended for 3 more days, and he was given a prednisone taper.  He comes in today where he continues to feel poorly, and is still coughing green mucus.  He is unsure if this is coming out of his chest, or whether it is postnasal drip from his sinuses.  He does have nasal congestion and a nasal voice, but denies sinus pressure or headache.  He does not feel overly congested, but does not feel that his breathing is back to baseline.  He is still having some blood tinge to his mucus at times, but it is not significant.   Review of Systems  Constitutional: Negative for fever and unexpected weight change.  HENT: Positive for nosebleeds, congestion and postnasal drip. Negative for ear pain, sore throat, rhinorrhea, sneezing, trouble swallowing, dental problem and sinus pressure.   Eyes: Negative for redness and itching.  Respiratory: Positive for cough, chest tightness and shortness of breath. Negative for wheezing.   Cardiovascular: Positive for palpitations and leg swelling.  Gastrointestinal: Negative for nausea and vomiting.  Genitourinary: Negative for dysuria.  Musculoskeletal: Negative for joint swelling.  Skin: Negative for rash.  Neurological: Positive for headaches.  Hematological: Does not bruise/bleed easily.  Psychiatric/Behavioral: Negative for dysphoric mood. The patient is not nervous/anxious.        Objective:   Physical  Exam Obese male in no acute distress Nose without purulent discharge noted, However he has erythematous mucosa and swollen turbinates bilaterally. Oropharynx clear Neck without lymphadenopathy or thyromegaly Chest totally clear to auscultation, no wheezes rhonchi or crackles Cardiac exam regular rate and rhythm Lower extremities without edema, cyanosis Alert and oriented, moves all 4 extremities.       Assessment & Plan:

## 2013-01-17 NOTE — Patient Instructions (Addendum)
Will check limited scan of sinuses to see if you have acute/chronic disease that may require more prolonged course. Will call you with results and discuss further plans.

## 2013-01-18 ENCOUNTER — Other Ambulatory Visit: Payer: Self-pay | Admitting: Pulmonary Disease

## 2013-01-18 MED ORDER — FLUTICASONE PROPIONATE 50 MCG/ACT NA SUSP: 2.0000 | Freq: Every day | NASAL | Status: DC

## 2013-01-18 MED ORDER — AMOXICILLIN-POT CLAVULANATE 875-125 MG PO TABS
1.0000 | ORAL_TABLET | Freq: Two times a day (BID) | ORAL | Status: DC
Start: 2013-01-18 — End: 2013-02-23

## 2013-01-18 NOTE — Telephone Encounter (Signed)
Rx's have been sent in per [facility]. (Augmentin and Flonase)

## 2013-02-23 ENCOUNTER — Ambulatory Visit (INDEPENDENT_AMBULATORY_CARE_PROVIDER_SITE_OTHER): Payer: BC Managed Care – PPO | Admitting: Critical Care Medicine

## 2013-02-23 ENCOUNTER — Encounter: Payer: Self-pay | Admitting: Critical Care Medicine

## 2013-02-23 VITALS — BP 136/94 | HR 84 | Temp 98.1°F | Ht 66.0 in | Wt 224.8 lb

## 2013-02-23 DIAGNOSIS — J42 Unspecified chronic bronchitis: Secondary | ICD-10-CM

## 2013-02-23 MED ORDER — FLUTICASONE FUROATE-VILANTEROL 100-25 MCG/INH IN AEPB: 1.0000 | INHALATION_SPRAY | Freq: Every day | RESPIRATORY_TRACT | Status: DC

## 2013-02-23 MED ORDER — FLUTICASONE PROPIONATE 50 MCG/ACT NA SUSP: 2.0000 | Freq: Every day | NASAL | Status: DC

## 2013-02-23 NOTE — Patient Instructions (Addendum)
Stop symbicort Start Breo one puff daily, use samples and one free refill Stay on flonase daily Stay on nasal sinus rinse Return 2 months

## 2013-02-23 NOTE — Assessment & Plan Note (Signed)
Recurrent tracheobronchitis with cyclical cough Normal spirometry Significant smoking history Plan Despite smoking history will continued inhaled steroid and long-acting beta agonist and began Breo one inhalation daily D/c symbicort

## 2013-02-23 NOTE — Progress Notes (Signed)
Subjective:    Patient ID: Ronald Delgado, male    DOB: 1951/08/09, 62 y.o.   MRN: [id number]  HPI  62 yo male with hx of chronic bronchitis , former smoker   4/8  Acute OV  Complains of increased SOB, wheezing, prod cough with green/bloody mucus - finished augmentin yesterday.  symptoms worsened 5 days ago.   Seen 1 week ago with acute bronchitis , tx w/ augmentin  CXR w/ NAD .  Still coughing up thick green mucus. Sinus congestion  No orthopnea, chest pain, edema , calf pain or fever  02/23/2013  Pt seen twice in 01/2013 Rx two rounds ABX Dx acute on chronic sinusits pos CT Sinus Rx prolonged augmentin plus nasal rinse and nasal steroids One week later better   Now is better. Pt is still dyspneic Notes some wheeze.  Occ cough not constant, dry cough.  No edema.  Notes some nasal drip.  No h/a or nasal pressure.   Review of Systems  Constitutional:   No  weight loss, night sweats,  Fevers, chills, fatigue, or  lassitude.  HEENT:   No headaches,  Difficulty swallowing,  Tooth/dental problems, or  Sore throat,                No sneezing, itching, ear ache, + nasal congestion, post nasal drip,   CV:  No chest pain,  Orthopnea, PND, swelling in lower extremities, anasarca, dizziness, palpitations, syncope.   GI  No heartburn, indigestion, abdominal pain, nausea, vomiting, diarrhea, change in bowel habits, loss of appetite, bloody stools.   Resp:    No chest wall deformity  Skin: no rash or lesions.  GU: no dysuria, change in color of urine, no urgency or frequency.  No flank pain, no hematuria   MS:  No joint pain or swelling.  No decreased range of motion.  No back pain.  Psych:  No change in mood or affect. No depression or anxiety.  No memory loss.         Objective:   Physical Exam BP 136/94  Pulse 84  Temp(Src) 98.1 F (36.7 C) (Oral)  Ht 5\' 6"  (1.676 m)  Wt 224 lb 12.8 oz (101.969 kg)  BMI 36.3 kg/m2  SpO2 95%  GEN: A/Ox3; pleasant , NAD,obese     HEENT:  [place]/AT,  EACs-clear, TMs-wnl, NOSE-clear, THROAT-clear, no lesions, no postnasal drip or exudate noted.   NECK:  Supple w/ fair ROM; no JVD; normal carotid impulses w/o bruits; no thyromegaly or nodules palpated; no lymphadenopathy.  RESP  Exp wheezing , few rhonchi no accessory muscle use, no dullness to percussion  CARD:  RRR, no m/r/g  , no peripheral edema, pulses intact, no cyanosis or clubbing.  GI:   Soft & nt; nml bowel sounds; no organomegaly or masses detected.  Musco: Warm bil, no deformities or joint swelling noted.   Neuro: alert, no focal deficits noted.    Skin: Warm, no lesions or rashes         Assessment & Plan:   Bronchitis, chronic Recurrent tracheobronchitis with cyclical cough Normal spirometry Significant smoking history Plan Despite smoking history will continued inhaled steroid and long-acting beta agonist and began Breo one inhalation daily D/c symbicort    Updated Medication List Outpatient Encounter Prescriptions as of 02/23/2013  Medication Sig Dispense Refill  . aspirin 81 MG tablet Take 81 mg by mouth daily.      Marland Kitchen atorvastatin (LIPITOR) 20 MG tablet Take 1 tablet by mouth daily.      Marland Kitchen  famotidine (PEPCID) 20 MG tablet Take 20 mg by mouth 2 (two) times daily.        . hydrochlorothiazide (HYDRODIURIL) 25 MG tablet Take 1 tablet by mouth daily.      Marland Kitchen HYDROcodone-acetaminophen (NORCO) 5-325 MG per tablet As needed       . nortriptyline (PAMELOR) 50 MG capsule Take 1 capsule by mouth Daily.      . pregabalin (LYRICA) 75 MG capsule Take 75 mg by mouth 2 (two) times daily.       . [DISCONTINUED] budesonide-formoterol (SYMBICORT) 160-4.5 MCG/ACT inhaler Inhale 2 puffs into the lungs 2 (two) times daily. Most days      . fluticasone (FLONASE) 50 MCG/ACT nasal spray Place 2 sprays into the nose daily.  16 g  6  . Fluticasone Furoate-Vilanterol (BREO ELLIPTA) 100-25 MCG/INH AEPB Inhale 1 puff into the lungs daily.  30 each  6  .  Fluticasone Furoate-Vilanterol (BREO ELLIPTA) 100-25 MCG/INH AEPB Inhale 1 puff into the lungs daily.  1 each  1  . HYDROcodone-homatropine (HYDROMET) 5-1.5 MG/5ML syrup Take 5 mLs by mouth every 6 (six) hours as needed for cough.  240 mL  0  . [DISCONTINUED] amoxicillin-clavulanate (AUGMENTIN) 875-125 MG per tablet Take 1 tablet by mouth 2 (two) times daily.  14 tablet  0  . [DISCONTINUED] fluticasone (FLONASE) 50 MCG/ACT nasal spray Place 2 sprays into the nose daily.  16 g  0  . [DISCONTINUED] Fluticasone Furoate-Vilanterol (BREO ELLIPTA) 100-25 MCG/INH AEPB Inhale 1 puff into the lungs daily.  30 each  6   No facility-administered encounter medications on file as of 02/23/2013.

## 2013-04-25 ENCOUNTER — Ambulatory Visit: Payer: BC Managed Care – PPO | Admitting: Critical Care Medicine

## 2013-06-07 ENCOUNTER — Other Ambulatory Visit: Payer: Self-pay | Admitting: Dermatology

## 2013-08-09 ENCOUNTER — Other Ambulatory Visit: Payer: Self-pay

## 2014-06-28 ENCOUNTER — Encounter: Payer: Self-pay | Admitting: *Deleted

## 2014-09-28 ENCOUNTER — Encounter (HOSPITAL_COMMUNITY): Payer: Self-pay | Admitting: Emergency Medicine

## 2014-09-28 ENCOUNTER — Emergency Department (HOSPITAL_COMMUNITY)
Admission: EM | Admit: 2014-09-28 | Discharge: 2014-09-28 | Disposition: A | Discharge status: Home / Self Care | Payer: 59 | Attending: Emergency Medicine | Admitting: Emergency Medicine

## 2014-09-28 ENCOUNTER — Emergency Department (HOSPITAL_COMMUNITY): Payer: 59

## 2014-09-28 DIAGNOSIS — Z8661 Personal history of infections of the central nervous system: Secondary | ICD-10-CM | POA: Diagnosis not present

## 2014-09-28 DIAGNOSIS — E785 Hyperlipidemia, unspecified: Secondary | ICD-10-CM | POA: Diagnosis not present

## 2014-09-28 DIAGNOSIS — R1032 Left lower quadrant pain: Secondary | ICD-10-CM | POA: Diagnosis present

## 2014-09-28 DIAGNOSIS — G20 Parkinson's disease: Secondary | ICD-10-CM | POA: Insufficient documentation

## 2014-09-28 DIAGNOSIS — Z87891 Personal history of nicotine dependence: Secondary | ICD-10-CM | POA: Insufficient documentation

## 2014-09-28 DIAGNOSIS — K5732 Diverticulitis of large intestine without perforation or abscess without bleeding: Secondary | ICD-10-CM | POA: Diagnosis not present

## 2014-09-28 DIAGNOSIS — R109 Unspecified abdominal pain: Secondary | ICD-10-CM

## 2014-09-28 DIAGNOSIS — M797 Fibromyalgia: Secondary | ICD-10-CM | POA: Diagnosis not present

## 2014-09-28 DIAGNOSIS — K219 Gastro-esophageal reflux disease without esophagitis: Secondary | ICD-10-CM | POA: Insufficient documentation

## 2014-09-28 DIAGNOSIS — Z79899 Other long term (current) drug therapy: Secondary | ICD-10-CM | POA: Diagnosis not present

## 2014-09-28 LAB — CBC WITH DIFFERENTIAL/PLATELET
Basophils Absolute: 0 10*3/uL (ref 0.0–0.1)
Basophils Relative: 0 % (ref 0–1)
EOS ABS: 0.1 10*3/uL (ref 0.0–0.7)
Eosinophils Relative: 1 % (ref 0–5)
HCT: 48.1 % (ref 39.0–52.0)
HEMOGLOBIN: 16.4 g/dL (ref 13.0–17.0)
LYMPHS ABS: 2.1 10*3/uL (ref 0.7–4.0)
Lymphocytes Relative: 22 % (ref 12–46)
MCH: 30.7 pg (ref 26.0–34.0)
MCHC: 34.1 g/dL (ref 30.0–36.0)
MCV: 90.1 fL (ref 78.0–100.0)
MONOS PCT: 13 % — AB (ref 3–12)
Monocytes Absolute: 1.2 10*3/uL — ABNORMAL HIGH (ref 0.1–1.0)
NEUTROS PCT: 64 % (ref 43–77)
Neutro Abs: 5.9 10*3/uL (ref 1.7–7.7)
Platelets: 190 10*3/uL (ref 150–400)
RBC: 5.34 MIL/uL (ref 4.22–5.81)
RDW: 13.6 % (ref 11.5–15.5)
WBC: 9.3 10*3/uL (ref 4.0–10.5)

## 2014-09-28 LAB — URINALYSIS, ROUTINE W REFLEX MICROSCOPIC
Bilirubin Urine: NEGATIVE
Glucose, UA: NEGATIVE mg/dL
HGB URINE DIPSTICK: NEGATIVE
Ketones, ur: NEGATIVE mg/dL
Leukocytes, UA: NEGATIVE
Nitrite: NEGATIVE
PROTEIN: NEGATIVE mg/dL
Specific Gravity, Urine: 1.017 (ref 1.005–1.030)
Urobilinogen, UA: 0.2 mg/dL (ref 0.0–1.0)
pH: 5.5 (ref 5.0–8.0)

## 2014-09-28 LAB — COMPREHENSIVE METABOLIC PANEL
ALK PHOS: 59 U/L (ref 39–117)
ALT: 18 U/L (ref 0–53)
ANION GAP: 8 (ref 5–15)
AST: 29 U/L (ref 0–37)
Albumin: 4.5 g/dL (ref 3.5–5.2)
BUN: 17 mg/dL (ref 6–23)
CO2: 28 mmol/L (ref 19–32)
Calcium: 9.3 mg/dL (ref 8.4–10.5)
Chloride: 104 mEq/L (ref 96–112)
Creatinine, Ser: 0.94 mg/dL (ref 0.50–1.35)
GFR calc non Af Amer: 88 mL/min — ABNORMAL LOW (ref >90)
GLUCOSE: 78 mg/dL (ref 70–99)
POTASSIUM: 4 mmol/L (ref 3.5–5.1)
Sodium: 140 mmol/L (ref 135–145)
TOTAL PROTEIN: 7.9 g/dL (ref 6.0–8.3)
Total Bilirubin: 1.2 mg/dL (ref 0.3–1.2)

## 2014-09-28 LAB — LIPASE, BLOOD: Lipase: 37 U/L (ref 11–59)

## 2014-09-28 MED ORDER — CIPROFLOXACIN HCL 500 MG PO TABS: 500.0000 mg | ORAL_TABLET | Freq: Two times a day (BID) | ORAL | Status: DC

## 2014-09-28 MED ORDER — HYDROCODONE-ACETAMINOPHEN 5-325 MG PO TABS: 1.0000 | ORAL_TABLET | ORAL | Status: AC | PRN

## 2014-09-28 MED ORDER — METRONIDAZOLE 500 MG PO TABS: 500.0000 mg | ORAL_TABLET | Freq: Three times a day (TID) | ORAL | Status: DC

## 2014-09-28 MED ORDER — IOHEXOL 300 MG/ML  SOLN
100.0000 mL | Freq: Once | INTRAMUSCULAR | Status: AC | PRN
  Administered 2014-09-28: 100 mL via INTRAVENOUS

## 2014-09-28 MED ORDER — METRONIDAZOLE 500 MG PO TABS
500.0000 mg | ORAL_TABLET | Freq: Once | ORAL | Status: AC
  Administered 2014-09-28: 500 mg via ORAL
  Filled 2014-09-28: qty 1

## 2014-09-28 MED ORDER — IOHEXOL 300 MG/ML  SOLN
25.0000 mL | Freq: Once | INTRAMUSCULAR | Status: AC | PRN
  Administered 2014-09-28: 25 mL via ORAL

## 2014-09-28 MED ORDER — CIPROFLOXACIN HCL 500 MG PO TABS
500.0000 mg | ORAL_TABLET | Freq: Once | ORAL | Status: AC
  Administered 2014-09-28: 500 mg via ORAL
  Filled 2014-09-28: qty 1

## 2014-09-28 NOTE — ED Provider Notes (Signed)
CSN: [id number]     Arrival date & time 09/28/14  1516 History   First MD Initiated Contact with Patient 09/28/14 1640     Chief Complaint  Patient presents with  . Abdominal Pain  . Sore Throat     (Consider location/radiation/quality/duration/timing/severity/associated sxs/prior Treatment) HPI Ronald Delgado is a 63 y.o. male with history of Parkinsonism, hx of encephalitis, fibromyalgia, hyperlipidemia, chronic headache, presents to emergency department complaining of left lower quadrant abdominal pain. Patient states that his pain began 3 days ago. States it's in the left lower quadrant. Onset is gradual. Pain is sharp. Pain is worse with movement and palpation of his abdomen. States at rest pain is about 4-10, but when he is palpating his abdomen is 10 out of 10. He reports history of diverticulitis 40 years ago. He states however that time they did not do a CT scan and just told him that's what they thought it was. He denies any other abdominal problems. Denies any prior abdominal surgeries. He states he took a Vicodin for his pain yesterday which did not help. He went to her primary care doctor walk-in clinic today and was sent here for further evaluation. He denies any nausea, vomiting. Normal bowel movement yesterday, none today. He denies any blood in his stool. He denies any prior colonoscopies. Denies fever or chills  Past Medical History  Diagnosis Date  . Hyperlipidemia   . Chronic headache   . Meningitis 2001    viral  . 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 GERD (gastroesophageal reflux disease)   . Cough   . Parkinsonism 2013    tremors  [facility]  . Fibromyalgia   . Former smoker   . Multiple cranial nerve palsies     HX of  . Facial pain     HA syndrome  . Insomnia   . Vitamin D deficiency   . Parkinsonism    Past Surgical History  Procedure Laterality Date  . Neck surgery    . Back surgery     Family History  Problem Relation Age of Onset  . Heart  disease Mother   . Breast cancer Mother   . Osteoporosis Mother   . Diabetes Mother   . Hypertension Mother   . Hypertension Father   . Hypercholesterolemia Father    History  Substance Use Topics  . Smoking status: Former Smoker -- 1.00 packs/day for 42 years    Types: Cigarettes    Quit date: 08/16/2000  . Smokeless tobacco: Never Used  . Alcohol Use: No    Review of Systems  Constitutional: Negative for fever and chills.  Respiratory: Negative for cough, chest tightness and shortness of breath.   Cardiovascular: Negative for chest pain, palpitations and leg swelling.  Gastrointestinal: Positive for abdominal pain. Negative for nausea, vomiting, diarrhea, constipation, blood in stool and abdominal distention.  Genitourinary: Negative for dysuria, urgency, frequency, hematuria and flank pain.  Musculoskeletal: Negative for myalgias, arthralgias, neck pain and neck stiffness.  Skin: Negative for rash.  Allergic/Immunologic: Negative for immunocompromised state.  Neurological: Negative for dizziness, weakness, light-headedness, numbness and headaches.      Allergies  Ceftin  Home Medications   Prior to Admission medications   Medication Sig Start Date End Date Taking? Authorizing Provider  atorvastatin (LIPITOR) 20 MG tablet Take 20 mg by mouth daily.    Yes Historical Provider, MD  carbidopa-levodopa (SINEMET IR) 25-100 MG per tablet Take 1 tablet by mouth 4 (four) times  daily.   Yes Historical Provider, MD  famotidine (PEPCID) 20 MG tablet Take 20 mg by mouth 2 (two) times daily.     Yes Historical Provider, MD  hydrochlorothiazide (HYDRODIURIL) 25 MG tablet Take 25 mg by mouth daily.    Yes Historical Provider, MD  HYDROcodone-acetaminophen (NORCO) 5-325 MG per tablet Take 1 tablet by mouth every 6 (six) hours as needed for moderate pain. As needed   Yes Historical Provider, MD  nortriptyline (PAMELOR) 50 MG capsule Take 50 mg by mouth at bedtime.    Yes Historical  Provider, MD  pregabalin (LYRICA) 75 MG capsule Take 75 mg by mouth 2 (two) times daily.    Yes Historical Provider, MD  HYDROcodone-homatropine (HYDROMET) 5-1.5 MG/5ML syrup Take 5 mLs by mouth every 6 (six) hours as needed for cough. Patient not taking: Reported on 09/28/2014 01/09/13   Tammy S Parrett, NP   BP 121/74 mmHg  Pulse 101  Temp(Src) 98.3 F (36.8 C) (Oral)  Resp 14  SpO2 97% Physical Exam  Constitutional: He appears well-developed and well-nourished. No distress.  HENT:  Head: Normocephalic and atraumatic.  Eyes: Conjunctivae are normal.  Neck: Neck supple.  Cardiovascular: Normal rate, regular rhythm and normal heart sounds.   Pulmonary/Chest: Effort normal. No respiratory distress. He has no wheezes. He has no rales.  Abdominal: Soft. Bowel sounds are normal. He exhibits no distension. There is tenderness. There is no rebound and no guarding.  LLQ tenderness  Musculoskeletal: He exhibits no edema.  Neurological: He is alert.  Skin: Skin is warm and dry.  Nursing note and vitals reviewed.   ED Course  Procedures (including critical care time) Labs Review Labs Reviewed  CBC WITH DIFFERENTIAL - Abnormal; Notable for the following:    Monocytes Relative 13 (*)    Monocytes Absolute 1.2 (*)    All other components within normal limits  COMPREHENSIVE METABOLIC PANEL - Abnormal; Notable for the following:    GFR calc non Af Amer 88 (*)    All other components within normal limits  LIPASE, BLOOD  URINALYSIS, ROUTINE W REFLEX MICROSCOPIC    Imaging Review Ct Abdomen Pelvis W Contrast  09/28/2014   CLINICAL DATA:  Left lower quadrant pain for 5 days  EXAM: CT ABDOMEN AND PELVIS WITH CONTRAST  TECHNIQUE: Multidetector CT imaging of the abdomen and pelvis was performed using the standard protocol following bolus administration of intravenous contrast.  CONTRAST:  OMNIPAQUE IOHEXOL 300 MG/ML SOLN, 25mL OMNIPAQUE IOHEXOL 300 MG/ML SOLN  COMPARISON:  None.  FINDINGS:  Lung bases are free of acute infiltrate or sizable effusion. Some scarring is noted in the left lung base.  The liver is diffusely decreased in attenuation consistent with fatty infiltration. The spleen, adrenal glands, pancreas and gallbladder are within normal limits. The kidneys are well visualized and demonstrate a normal enhancement pattern.  The appendix is within normal limits. Scattered diverticular changes identified. Some pericolonic inflammatory changes are noted at the junction of the descending and sigmoid colon consistent with diverticulitis. No perforation or abscess is noted at this time. Mild aortoiliac calcifications are seen. The bladder is partially distended. No pelvic mass lesion is noted. Postsurgical changes are noted in the lower lumbar spine.  IMPRESSION: Changes consistent with mild diverticulitis without abscess or perforation.  Fatty infiltration of the liver.  No other focal abnormality is seen.   Electronically Signed   By: Alcide Clever M.D.   On: 09/28/2014 19:06     EKG Interpretation None  MDM   Final diagnoses:  Left sided abdominal pain    patient with left lower quadrant pain for 3-4 days, worsening. No associated symptoms. No fever. Vital signs are normal here. Will get blood work, CT abdomen to rule out diverticulitis. Given his benign, no guarding or rebound tenderness.   7:41 PM Patient CT scan showing mild diverticulitis with no abscess or perforation. Vital signs remained normal, labs unremarkable. Will discharge home with Cipro and Flagyl, pain medications, follow-up with primary care doctor next week. Return precautions discussed. Patient is not having nausea or vomiting, no indication for admission at this time. He agrees with the plan. First doses of antibiotics given in ED  Filed Vitals:   09/28/14 1538 09/28/14 1803 09/28/14 1943  BP: 121/74 130/86 138/90  Pulse: 101 88 88  Temp: 98.3 F (36.8 C) 98.4 F (36.9 C) 98.1 F (36.7 C)   TempSrc: Oral Oral Oral  Resp: 14 18 18   SpO2: 97% 95% 96%     Lottie Musselatyana A Romario Tith, PA-C 09/29/14 0053  Mirian MoMatthew Gentry, MD 10/05/14 [phone]

## 2014-09-28 NOTE — ED Notes (Signed)
Patient waiting for CT.

## 2014-09-28 NOTE — Discharge Instructions (Signed)
cipro and flagyl as prescribed until all gone for infection. Pain medications only as needed. See information below. Return if worsening, otherwise follow up with your doctor next week.    Diverticulitis Diverticulitis is inflammation or infection of small pouches in your colon that form when you have a condition called diverticulosis. The pouches in your colon are called diverticula. Your colon, or large intestine, is where water is absorbed and stool is formed. Complications of diverticulitis can include:  Bleeding.  Severe infection.  Severe pain.  Perforation of your colon.  Obstruction of your colon. CAUSES  Diverticulitis is caused by bacteria. Diverticulitis happens when stool becomes trapped in diverticula. This allows bacteria to grow in the diverticula, which can lead to inflammation and infection. RISK FACTORS People with diverticulosis are at risk for diverticulitis. Eating a diet that does not include enough fiber from fruits and vegetables may make diverticulitis more likely to develop. SYMPTOMS  Symptoms of diverticulitis may include:  Abdominal pain and tenderness. The pain is normally located on the left side of the abdomen, but may occur in other areas.  Fever and chills.  Bloating.  Cramping.  Nausea.  Vomiting.  Constipation.  Diarrhea.  Blood in your stool. DIAGNOSIS  Your health care provider will ask you about your medical history and do a physical exam. You may need to have tests done because many medical conditions can cause the same symptoms as diverticulitis. Tests may include:  Blood tests.  Urine tests.  Imaging tests of the abdomen, including X-rays and CT scans. When your condition is under control, your health care provider may recommend that you have a colonoscopy. A colonoscopy can show how severe your diverticula are and whether something else is causing your symptoms. TREATMENT  Most cases of diverticulitis are mild and can be  treated at home. Treatment may include:  Taking over-the-counter pain medicines.  Following a clear liquid diet.  Taking antibiotic medicines by mouth for 7-10 days. More severe cases may be treated at a hospital. Treatment may include:  Not eating or drinking.  Taking prescription pain medicine.  Receiving antibiotic medicines through an IV tube.  Receiving fluids and nutrition through an IV tube.  Surgery. HOME CARE INSTRUCTIONS   Follow your health care provider's instructions carefully.  Follow a full liquid diet or other diet as directed by your health care provider. After your symptoms improve, your health care provider may tell you to change your diet. He or she may recommend you eat a high-fiber diet. Fruits and vegetables are good sources of fiber. Fiber makes it easier to pass stool.  Take fiber supplements or probiotics as directed by your health care provider.  Only take medicines as directed by your health care provider.  Keep all your follow-up appointments. SEEK MEDICAL CARE IF:   Your pain does not improve.  You have a hard time eating food.  Your bowel movements do not return to normal. SEEK IMMEDIATE MEDICAL CARE IF:   Your pain becomes worse.  Your symptoms do not get better.  Your symptoms suddenly get worse.  You have a fever.  You have repeated vomiting.  You have bloody or black, tarry stools. MAKE SURE YOU:   Understand these instructions.  Will watch your condition.  Will get help right away if you are not doing well or get worse. Document Released: 06/30/2005 Document Revised: 09/25/2013 Document Reviewed: 08/15/2013 [facility] Patient Information 2015 [employer] [place]. This information is not intended to replace advice given to you  by your health care provider. Make sure you discuss any questions you have with your health care provider. ° °

## 2014-09-28 NOTE — ED Notes (Addendum)
[facility] pt c/o LLQ pain, with chills and sore throat x 4 days but has progressed.   Pt describes pain as constant, dull pain but with movement/palpation the pain is sharp. Pt denies v/n/d.

## 2014-11-27 ENCOUNTER — Other Ambulatory Visit: Payer: Self-pay | Admitting: Family Medicine

## 2014-11-27 ENCOUNTER — Ambulatory Visit
Admission: RE | Admit: 2014-11-27 | Discharge: 2014-11-27 | Disposition: A | Discharge status: Home / Self Care | Payer: 59 | Source: Ambulatory Visit | Attending: Family Medicine | Admitting: Family Medicine

## 2014-11-27 DIAGNOSIS — J40 Bronchitis, not specified as acute or chronic: Secondary | ICD-10-CM

## 2014-12-18 ENCOUNTER — Encounter: Payer: Self-pay | Admitting: Critical Care Medicine

## 2014-12-18 ENCOUNTER — Ambulatory Visit (INDEPENDENT_AMBULATORY_CARE_PROVIDER_SITE_OTHER): Payer: 59 | Admitting: Critical Care Medicine

## 2014-12-18 VITALS — BP 118/70 | HR 84 | Temp 98.1°F | Ht 67.0 in | Wt 219.0 lb

## 2014-12-18 DIAGNOSIS — R0600 Dyspnea, unspecified: Secondary | ICD-10-CM

## 2014-12-18 DIAGNOSIS — R042 Hemoptysis: Secondary | ICD-10-CM

## 2014-12-18 DIAGNOSIS — J9811 Atelectasis: Secondary | ICD-10-CM

## 2014-12-18 NOTE — Patient Instructions (Signed)
A CT chest will be obtained Lung function studies will be obtained Return 1 month, I will call sooner with report

## 2014-12-18 NOTE — Progress Notes (Signed)
Subjective:    Patient ID: Ronald Delgado, male    DOB: 11-May-1951, 64 y.o.   MRN: [id number]  HPI 64 y.o. male with hx of chronic bronchitis, recurrent hemoptysis , former smoker  .  12/18/2014 Chief Complaint  Patient presents with  . Follow-up    Last seen 02/23/13. C/O increased SOB x 3 weeks. Had CXR with PCP stating actelastasis. PCP rx a zpack. Pt states also experiencing hemoptysis before xray.   Last seen 02/2013, then 4-5 months more dyspnea then worsening x 3 weeks.  Pt now coughing up blood  Pt did have some R central chest pain. No edema in feet.  No real nasal congestion.  Pt denies any significant sore throat, nasal congestion or excess secretions, fever, chills, sweats, unintended weight loss, pleurtic or exertional chest pain, orthopnea PND, or leg swelling Pt denies any increase in rescue therapy over baseline, denies waking up needing it or having any early am or nocturnal exacerbations of coughing/wheezing/or dyspnea. Pt also denies any obvious fluctuation in symptoms with  weather or environmental change or other alleviating or aggravating factors    Review of Systems Constitutional:   No  weight loss, night sweats,  Fevers, chills, fatigue, or  lassitude.  HEENT:   No headaches,  Difficulty swallowing,  Tooth/dental problems, or  Sore throat,                No sneezing, itching, ear ache, + nasal congestion, post nasal drip,   CV:  No chest pain,  Orthopnea, PND, swelling in lower extremities, anasarca, dizziness, palpitations, syncope.   GI  No heartburn, indigestion, abdominal pain, nausea, vomiting, diarrhea, change in bowel habits, loss of appetite, bloody stools.   Resp:    No chest wall deformity  ++HEMOPTYSIS  Skin: no rash or lesions.  GU: no dysuria, change in color of urine, no urgency or frequency.  No flank pain, no hematuria   MS:  No joint pain or swelling.  No decreased range of motion.  No back pain.  Psych:  No change in mood or affect.  No depression or anxiety.  No memory loss.         Objective:   Physical ExamBP 118/70 mmHg  Pulse 84  Temp(Src) 98.1 F (36.7 C) (Oral)  Ht  (1.702 m)  Wt 219 lb (99.338 kg)  BMI 34.29 kg/m2  SpO2 97%  GEN: A/Ox3; pleasant , NAD,obese    HEENT:  [place]/AT,  EACs-clear, TMs-wnl, NOSE-clear, THROAT-clear, no lesions, no postnasal drip or exudate noted.   NECK:  Supple w/ fair ROM; no JVD; normal carotid impulses w/o bruits; no thyromegaly or nodules palpated; no lymphadenopathy.  RESP clear, no accessory muscle use, no dullness to percussion  CARD:  RRR, no m/r/g  , no peripheral edema, pulses intact, no cyanosis or clubbing.  GI:   Soft & nt; nml bowel sounds; no organomegaly or masses detected.  Musco: Warm bil, no deformities or joint swelling noted.   Neuro: alert, no focal deficits noted.    Skin: Warm, no lesions or rashes   Recent CXR reviewed: RML atx seen      Assessment & Plan:   Hemoptysis Recurrent hemoptysis and bronchitis RML ATX on recent CXR Plan A CT chest will be obtained Lung function studies will be obtained       Updated Medication List Outpatient Encounter Prescriptions as of 12/18/2014  Medication Sig  . atorvastatin (LIPITOR) 20 MG tablet Take 20 mg by mouth daily.   Marland Kitchen  carbidopa-levodopa (SINEMET IR) 25-100 MG per tablet Take 1 tablet by mouth 4 (four) times daily.  . famotidine (PEPCID) 20 MG tablet Take 20 mg by mouth 2 (two) times daily.    . hydrochlorothiazide (HYDRODIURIL) 25 MG tablet Take 25 mg by mouth daily.   Marland Kitchen. HYDROcodone-acetaminophen (NORCO/VICODIN) 5-325 MG per tablet Take 1-2 tablets by mouth every 4 (four) hours as needed for moderate pain or severe pain.  . nortriptyline (PAMELOR) 50 MG capsule Take 50 mg by mouth at bedtime.   . pregabalin (LYRICA) 75 MG capsule Take 75 mg by mouth 2 (two) times daily.   . [DISCONTINUED] ciprofloxacin (CIPRO) 500 MG tablet Take 1 tablet (500 mg total) by mouth 2 (two) times  daily.  . [DISCONTINUED] HYDROcodone-homatropine (HYDROMET) 5-1.5 MG/5ML syrup Take 5 mLs by mouth every 6 (six) hours as needed for cough. (Patient not taking: Reported on 09/28/2014)  . [DISCONTINUED] metroNIDAZOLE (FLAGYL) 500 MG tablet Take 1 tablet (500 mg total) by mouth 3 (three) times daily.

## 2014-12-19 NOTE — Assessment & Plan Note (Signed)
Recurrent hemoptysis and bronchitis RML ATX on recent CXR Plan A CT chest will be obtained Lung function studies will be obtained

## 2014-12-23 ENCOUNTER — Ambulatory Visit (INDEPENDENT_AMBULATORY_CARE_PROVIDER_SITE_OTHER)
Admission: RE | Admit: 2014-12-23 | Discharge: 2014-12-23 | Disposition: A | Discharge status: Home / Self Care | Payer: 59 | Source: Ambulatory Visit | Attending: Critical Care Medicine | Admitting: Critical Care Medicine

## 2014-12-23 DIAGNOSIS — J9811 Atelectasis: Secondary | ICD-10-CM

## 2014-12-23 DIAGNOSIS — R042 Hemoptysis: Secondary | ICD-10-CM

## 2014-12-26 ENCOUNTER — Telehealth: Payer: Self-pay | Admitting: *Deleted

## 2014-12-26 MED ORDER — BECLOMETHASONE DIPROPIONATE 80 MCG/ACT IN AERS: 2.0000 | INHALATION_SPRAY | Freq: Two times a day (BID) | RESPIRATORY_TRACT | Status: DC

## 2014-12-26 MED ORDER — AZITHROMYCIN 250 MG PO TABS: ORAL_TABLET | ORAL | Status: DC

## 2014-12-26 MED ORDER — PREDNISONE 10 MG PO TABS: ORAL_TABLET | ORAL | Status: DC

## 2014-12-26 NOTE — Telephone Encounter (Signed)
Prednisone, qvar, and azithromycin rxs sent to [facility].    lmomtcb for pt -- Will need to inform pt of above.  Also, ask pt to hold nortriptyline while on azithromycin d/t interaction.  He may resume nortriptyline after finishing azithromycin and to remind pt of pending April 20th appt with Dr. Delford FieldWright and PFTs.

## 2014-12-26 NOTE — Telephone Encounter (Signed)
Pt is aware that his medications have been sent in and medication interactions. Confirmed appointments as well. Nothing further was needed.

## 2014-12-26 NOTE — Telephone Encounter (Signed)
-----   Message from Storm FriskPatrick E Wright, MD sent at 12/26/2014 12:13 PM EDT ----- Pt aware of results Rx : prednisone 10mg   Take 4 for three days 3 for three days 2 for three days 1 for three days and stop #30 Qvar 80 two puff twice daily #1 6RF Azithromycin 250mg  Sig: 2 daily for 3 days :  #6  Keep next ov

## 2014-12-26 NOTE — Telephone Encounter (Signed)
Pt returned call - [phone]

## 2014-12-26 NOTE — Progress Notes (Signed)
Quick Note:  See 12/26/14 phone msg. ______

## 2015-01-22 ENCOUNTER — Ambulatory Visit (INDEPENDENT_AMBULATORY_CARE_PROVIDER_SITE_OTHER): Payer: 59 | Admitting: Critical Care Medicine

## 2015-01-22 ENCOUNTER — Encounter: Payer: Self-pay | Admitting: Critical Care Medicine

## 2015-01-22 VITALS — BP 114/76 | HR 86 | Temp 98.1°F | Ht 65.75 in | Wt 220.0 lb

## 2015-01-22 DIAGNOSIS — J9811 Atelectasis: Secondary | ICD-10-CM | POA: Diagnosis not present

## 2015-01-22 DIAGNOSIS — R042 Hemoptysis: Secondary | ICD-10-CM | POA: Diagnosis not present

## 2015-01-22 DIAGNOSIS — R0600 Dyspnea, unspecified: Secondary | ICD-10-CM

## 2015-01-22 LAB — PULMONARY FUNCTION TEST
DL/VA % pred: 97 %
DL/VA: 4.22 ml/min/mmHg/L
DLCO unc % pred: 95 %
DLCO unc: 25.48 ml/min/mmHg
FEF 25-75 Post: 4.83 L/sec
FEF 25-75 Pre: 5.16 L/sec
FEF2575-%CHANGE-POST: -6 %
FEF2575-%PRED-POST: 196 %
FEF2575-%Pred-Pre: 210 %
FEV1-%Change-Post: -3 %
FEV1-%PRED-PRE: 121 %
FEV1-%Pred-Post: 116 %
FEV1-POST: 3.49 L
FEV1-Pre: 3.62 L
FEV1FVC-%CHANGE-POST: 0 %
FEV1FVC-%Pred-Pre: 115 %
FEV6-%CHANGE-POST: -5 %
FEV6-%Pred-Post: 104 %
FEV6-%Pred-Pre: 110 %
FEV6-Post: 3.95 L
FEV6-Pre: 4.18 L
FEV6FVC-%Pred-Post: 105 %
FEV6FVC-%Pred-Pre: 105 %
FVC-%CHANGE-POST: -2 %
FVC-%PRED-POST: 101 %
FVC-%Pred-Pre: 104 %
FVC-Post: 4.06 L
FVC-Pre: 4.18 L
POST FEV1/FVC RATIO: 86 %
Post FEV6/FVC ratio: 100 %
Pre FEV1/FVC ratio: 87 %
Pre FEV6/FVC Ratio: 100 %
RV % PRED: 79 %
RV: 1.65 L
TLC % pred: 94 %
TLC: 5.82 L

## 2015-01-22 NOTE — Patient Instructions (Addendum)
A bronchoscopy will be obtained at 730AM on 01/31/15,  Arrive at 615AM,  Nothing by mouth after midnight No change in medicaitons Stay on Qvar two puff twice daily

## 2015-01-22 NOTE — Progress Notes (Signed)
Subjective:    Patient ID: Ronald Delgado, male    DOB: 07/11/1951, 63 y.o.   MRN: [id number]  HPI 63 y.o. male with hx of chronic bronchitis, recurrent hemoptysis , former smoker   01/22/2015 Chief Complaint  Patient presents with  . 1 month follow up    with PFTs.  SOB and cough unchanged.  Cough prod with occas small amount of bright red blood.  Occas CP.  No wheezing.    Pt still with hemoptysis.  Still couging. CT chest done: bronchiectasis LLs.  pfts done today          Review of Systems Constitutional:   No  weight loss, night sweats,  Fevers, chills, fatigue, or  lassitude.  HEENT:   No headaches,  Difficulty swallowing,  Tooth/dental problems, or  Sore throat,                No sneezing, itching, ear ache, + nasal congestion, post nasal drip,   CV:  No chest pain,  Orthopnea, PND, swelling in lower extremities, anasarca, dizziness, palpitations, syncope.   GI  No heartburn, indigestion, abdominal pain, nausea, vomiting, diarrhea, change in bowel habits, loss of appetite, bloody stools.   Resp:    No chest wall deformity  ++HEMOPTYSIS  Skin: no rash or lesions.  GU: no dysuria, change in color of urine, no urgency or frequency.  No flank pain, no hematuria   MS:  No joint pain or swelling.  No decreased range of motion.  No back pain.  Psych:  No change in mood or affect. No depression or anxiety.  No memory loss.         Objective:   Physical ExamBP 114/76 mmHg  Pulse 86  Temp(Src) 98.1 F (36.7 C) (Oral)  Ht 5' 5.75" (1.67 m)  Wt 220 lb (99.791 kg)  BMI 35.78 kg/m2  SpO2 94%  GEN: A/Ox3; pleasant , NAD,obese    HEENT:  [place]/AT,  EACs-clear, TMs-wnl, NOSE-clear, THROAT-clear, no lesions, no postnasal drip or exudate noted.   NECK:  Supple w/ fair ROM; no JVD; normal carotid impulses w/o bruits; no thyromegaly or nodules palpated; no lymphadenopathy.  RESP clear, no accessory muscle use, no dullness to percussion  CARD:  RRR, no m/r/g  , no  peripheral edema, pulses intact, no cyanosis or clubbing.  GI:   Soft & nt; nml bowel sounds; no organomegaly or masses detected.  Musco: Warm bil, no deformities or joint swelling noted.   Neuro: alert, no focal deficits noted.    Skin: Warm, no lesions or rashes   CT Chest bilateral RML, L lingular bronchiectasis  01/22/2015 PFTs : nL lung volumes, DLCO, spirometry     Assessment & Plan:   Hemoptysis Recurrent hemoptysis with airway inflammation, bronchiectasis , ? endobronch lesion, ? Atypical airway infection Plan Fob 01/31/15 Cont qvar No ABX for now      Updated Medication List Outpatient Encounter Prescriptions as of 01/22/2015  Medication Sig  . atorvastatin (LIPITOR) 20 MG tablet Take 20 mg by mouth daily.   . beclomethasone (QVAR) 80 MCG/ACT inhaler Inhale 2 puffs into the lungs 2 (two) times daily.  . carbidopa-levodopa (SINEMET IR) 25-100 MG per tablet Take 1 tablet by mouth 4 (four) times daily.  . famotidine (PEPCID) 20 MG tablet Take 20 mg by mouth 2 (two) times daily.    . hydrochlorothiazide (HYDRODIURIL) 25 MG tablet Take 25 mg by mouth daily.   . HYDROcodone-acetaminophen (NORCO/VICODIN) 5-325 MG per tablet Take 1-2   tablets by mouth every 4 (four) hours as needed for moderate pain or severe pain.  . nortriptyline (PAMELOR) 50 MG capsule Take 50 mg by mouth at bedtime.   . pregabalin (LYRICA) 75 MG capsule Take 75 mg by mouth 2 (two) times daily.   . [DISCONTINUED] azithromycin (ZITHROMAX) 250 MG tablet 2 daily for 3 days (Patient not taking: Reported on 01/22/2015)  . [DISCONTINUED] predniSONE (DELTASONE) 10 MG tablet Take 4 for three days 3 for three days 2 for three days 1 for three days and stop (Patient not taking: Reported on 01/22/2015)      

## 2015-01-22 NOTE — Progress Notes (Signed)
PFT done today. 

## 2015-01-22 NOTE — Assessment & Plan Note (Signed)
Recurrent hemoptysis with airway inflammation, bronchiectasis , ? endobronch lesion, ? Atypical airway infection Plan Fob 01/31/15 Cont qvar No ABX for now

## 2015-01-30 ENCOUNTER — Telehealth: Payer: Self-pay | Admitting: Critical Care Medicine

## 2015-01-30 NOTE — Telephone Encounter (Signed)
Pt advised that he can take regular meds prior to having Bronch.

## 2015-01-31 ENCOUNTER — Ambulatory Visit (HOSPITAL_COMMUNITY)
Admission: RE | Admit: 2015-01-31 | Discharge: 2015-01-31 | Disposition: A | Discharge status: Home / Self Care | Payer: 59 | Source: Ambulatory Visit | Attending: Critical Care Medicine | Admitting: Critical Care Medicine

## 2015-01-31 ENCOUNTER — Encounter (HOSPITAL_COMMUNITY)
Admission: RE | Disposition: A | Discharge status: Home / Self Care | Payer: Self-pay | Source: Ambulatory Visit | Attending: Critical Care Medicine

## 2015-01-31 DIAGNOSIS — J479 Bronchiectasis, uncomplicated: Secondary | ICD-10-CM | POA: Insufficient documentation

## 2015-01-31 DIAGNOSIS — Z8709 Personal history of other diseases of the respiratory system: Secondary | ICD-10-CM | POA: Insufficient documentation

## 2015-01-31 DIAGNOSIS — R042 Hemoptysis: Secondary | ICD-10-CM | POA: Diagnosis present

## 2015-01-31 DIAGNOSIS — Z6835 Body mass index (BMI) 35.0-35.9, adult: Secondary | ICD-10-CM | POA: Insufficient documentation

## 2015-01-31 DIAGNOSIS — Z79891 Long term (current) use of opiate analgesic: Secondary | ICD-10-CM | POA: Insufficient documentation

## 2015-01-31 DIAGNOSIS — E669 Obesity, unspecified: Secondary | ICD-10-CM | POA: Insufficient documentation

## 2015-01-31 DIAGNOSIS — Z79899 Other long term (current) drug therapy: Secondary | ICD-10-CM | POA: Diagnosis not present

## 2015-01-31 DIAGNOSIS — Z87891 Personal history of nicotine dependence: Secondary | ICD-10-CM | POA: Diagnosis not present

## 2015-01-31 DIAGNOSIS — Z7951 Long term (current) use of inhaled steroids: Secondary | ICD-10-CM | POA: Insufficient documentation

## 2015-01-31 DIAGNOSIS — J471 Bronchiectasis with (acute) exacerbation: Secondary | ICD-10-CM | POA: Diagnosis not present

## 2015-01-31 DIAGNOSIS — J984 Other disorders of lung: Secondary | ICD-10-CM | POA: Insufficient documentation

## 2015-01-31 HISTORY — PX: VIDEO BRONCHOSCOPY: SHX5072

## 2015-01-31 SURGERY — VIDEO BRONCHOSCOPY WITHOUT FLUORO
Anesthesia: Moderate Sedation | Laterality: Bilateral

## 2015-01-31 MED ORDER — FENTANYL CITRATE (PF) 100 MCG/2ML IJ SOLN
INTRAMUSCULAR | Status: AC
  Filled 2015-01-31: qty 4

## 2015-01-31 MED ORDER — LIDOCAINE HCL 1 % IJ SOLN
INTRAMUSCULAR | Status: DC | PRN
  Administered 2015-01-31: 6 mL via RESPIRATORY_TRACT

## 2015-01-31 MED ORDER — MIDAZOLAM HCL 10 MG/2ML IJ SOLN
INTRAMUSCULAR | Status: DC | PRN
  Administered 2015-01-31: 2 mg via INTRAVENOUS
  Administered 2015-01-31: 3 mg via INTRAVENOUS

## 2015-01-31 MED ORDER — SODIUM CHLORIDE 0.9 % IV SOLN: Freq: Once | INTRAVENOUS | Status: DC

## 2015-01-31 MED ORDER — PHENYLEPHRINE HCL 0.25 % NA SOLN
NASAL | Status: DC | PRN
  Administered 2015-01-31: 2 via NASAL

## 2015-01-31 MED ORDER — MIDAZOLAM HCL 10 MG/2ML IJ SOLN
INTRAMUSCULAR | Status: AC
  Filled 2015-01-31: qty 4

## 2015-01-31 MED ORDER — BUTAMBEN-TETRACAINE-BENZOCAINE 2-2-14 % EX AERO: 1.0000 | INHALATION_SPRAY | Freq: Once | CUTANEOUS | Status: DC

## 2015-01-31 MED ORDER — LIDOCAINE HCL 2 % EX GEL: Freq: Once | CUTANEOUS | Status: DC

## 2015-01-31 MED ORDER — LIDOCAINE HCL 2 % EX GEL
CUTANEOUS | Status: DC | PRN
  Administered 2015-01-31: 1

## 2015-01-31 MED ORDER — PHENYLEPHRINE HCL 0.25 % NA SOLN: 1.0000 | Freq: Four times a day (QID) | NASAL | Status: DC | PRN

## 2015-01-31 MED ORDER — FENTANYL CITRATE (PF) 100 MCG/2ML IJ SOLN
INTRAMUSCULAR | Status: DC | PRN
  Administered 2015-01-31: 20 ug via INTRAVENOUS
  Administered 2015-01-31: 30 ug via INTRAVENOUS

## 2015-01-31 MED ORDER — SODIUM CHLORIDE 0.9 % IV SOLN
INTRAVENOUS | Status: DC
  Administered 2015-01-31: 07:00:00 via INTRAVENOUS

## 2015-01-31 NOTE — Progress Notes (Signed)
Video bronchoscopy performed Intervention bronchial washings Intervention bronchial brushings Pt tolerated well  Swetha Rayle David RRT  

## 2015-01-31 NOTE — Op Note (Signed)
Bronchoscopy Procedure Note  Date of Operation: 01/31/2015  Pre-op Diagnosis: hemoptysis, bronchiectasis  Post-op Diagnosis:  Same, no endobronchial lesions  Surgeon: Shan LevansPatrick Wright  Anesthesia: Monitored Local Anesthesia with Sedation:    Versed 5 mg IVP;  Fentanyl 50 mcg IVP  Operation: Flexible fiberoptic bronchoscopy, diagnostic   Findings: Bronchiectatic airways, no endobronchial lesions  Specimen: Brushing LLL, BAL LLL  Estimated Blood Loss: Minimal  Complications:  None  Indications and History: The patient is a 64 y.o. male with hemoptysis, bronchiectasis.  The risks, benefits, complications, treatment options and expected outcomes were discussed with the patient.  The possibilities of reaction to medication, pulmonary aspiration, perforation of a viscus, bleeding, failure to diagnose a condition and creating a complication requiring transfusion or operation were discussed with the patient who freely signed the consent.    Description of Procedure: The patient was re-examined in the bronchoscopy suite and the site of surgery properly noted/marked.  The patient was identified as Ronald Delgado and the procedure verified as Flexible Fiberoptic Bronchoscopy.  A Time Out was held and the above information confirmed.   After the induction of topical nasopharyngeal anesthesia, the patient was positioned  and the bronchoscope was passed through the R nares. The vocal cords were visualized and  1% buffered lidocaine 5 ml was topically placed onto the cords. The cords were normal. The scope was then passed into the trachea.  1% buffered lidocaine 5 ml was used topically on the carina.  Careful inspection of the tracheal lumen was accomplished. The scope was sequentially passed into the left main and then left upper and lower bronchi and segmental bronchi.     BAL LLL Lateral basilar segment, brushings LLL  was done and there was two specimens.   The scope was then withdrawn and  advanced into the right main bronchus and then into the RUL, RML, and RLL bronchi and segmental bronchi.    Endobronchial findings: none Trachea: Normal mucosa Carina: Normal mucosa Right main bronchus: Normal mucosa Right upper lobe bronchus: Normal mucosa Right middle lobe bronchus: Normal mucosa Right lower lobe bronchus: Normal mucosa, bronchiectatic RLL subsegmental airways , no endobronchial lesions Left main bronchus: Normal mucosa Left upper lobe bronchus: Normal mucosa Left lower lobe bronchus: bronchiectatic LLL subsegments, no endobronchial lesions, redundant airway LLL lateral basilar segment  The Patient was taken to the Endoscopy Recovery area in satisfactory condition.  Attestation: I performed the procedure.  Shan Levansatrick Wright 01/31/2015 7:49 AM

## 2015-01-31 NOTE — H&P (View-Only) (Signed)
Subjective:    Patient ID: Ronald Delgado, male    DOB: October 22, 1950, 64 y.o.   MRN: [id number]  HPI 64 y.o. male with hx of chronic bronchitis, recurrent hemoptysis , former smoker   01/22/2015 Chief Complaint  Patient presents with  . 1 month follow up    with PFTs.  SOB and cough unchanged.  Cough prod with occas small amount of bright red blood.  Occas CP.  No wheezing.    Pt still with hemoptysis.  Still couging. CT chest done: bronchiectasis LLs.  pfts done today          Review of Systems Constitutional:   No  weight loss, night sweats,  Fevers, chills, fatigue, or  lassitude.  HEENT:   No headaches,  Difficulty swallowing,  Tooth/dental problems, or  Sore throat,                No sneezing, itching, ear ache, + nasal congestion, post nasal drip,   CV:  No chest pain,  Orthopnea, PND, swelling in lower extremities, anasarca, dizziness, palpitations, syncope.   GI  No heartburn, indigestion, abdominal pain, nausea, vomiting, diarrhea, change in bowel habits, loss of appetite, bloody stools.   Resp:    No chest wall deformity  ++HEMOPTYSIS  Skin: no rash or lesions.  GU: no dysuria, change in color of urine, no urgency or frequency.  No flank pain, no hematuria   MS:  No joint pain or swelling.  No decreased range of motion.  No back pain.  Psych:  No change in mood or affect. No depression or anxiety.  No memory loss.         Objective:   Physical ExamBP 114/76 mmHg  Pulse 86  Temp(Src) 98.1 F (36.7 C) (Oral)  Ht 5' 5.75" (1.67 m)  Wt 220 lb (99.791 kg)  BMI 35.78 kg/m2  SpO2 94%  GEN: A/Ox3; pleasant , NAD,obese    HEENT:  [place]/AT,  EACs-clear, TMs-wnl, NOSE-clear, THROAT-clear, no lesions, no postnasal drip or exudate noted.   NECK:  Supple w/ fair ROM; no JVD; normal carotid impulses w/o bruits; no thyromegaly or nodules palpated; no lymphadenopathy.  RESP clear, no accessory muscle use, no dullness to percussion  CARD:  RRR, no m/r/g  , no  peripheral edema, pulses intact, no cyanosis or clubbing.  GI:   Soft & nt; nml bowel sounds; no organomegaly or masses detected.  Musco: Warm bil, no deformities or joint swelling noted.   Neuro: alert, no focal deficits noted.    Skin: Warm, no lesions or rashes   CT Chest bilateral RML, L lingular bronchiectasis  01/22/2015 PFTs : nL lung volumes, DLCO, spirometry     Assessment & Plan:   Hemoptysis Recurrent hemoptysis with airway inflammation, bronchiectasis , ? endobronch lesion, ? Atypical airway infection Plan Fob 01/31/15 Cont qvar No ABX for now      Updated Medication List Outpatient Encounter Prescriptions as of 01/22/2015  Medication Sig  . atorvastatin (LIPITOR) 20 MG tablet Take 20 mg by mouth daily.   . beclomethasone (QVAR) 80 MCG/ACT inhaler Inhale 2 puffs into the lungs 2 (two) times daily.  . carbidopa-levodopa (SINEMET IR) 25-100 MG per tablet Take 1 tablet by mouth 4 (four) times daily.  . famotidine (PEPCID) 20 MG tablet Take 20 mg by mouth 2 (two) times daily.    . hydrochlorothiazide (HYDRODIURIL) 25 MG tablet Take 25 mg by mouth daily.   Marland Kitchen HYDROcodone-acetaminophen (NORCO/VICODIN) 5-325 MG per tablet Take 1-2  tablets by mouth every 4 (four) hours as needed for moderate pain or severe pain.  . nortriptyline (PAMELOR) 50 MG capsule Take 50 mg by mouth at bedtime.   . pregabalin (LYRICA) 75 MG capsule Take 75 mg by mouth 2 (two) times daily.   . [DISCONTINUED] azithromycin (ZITHROMAX) 250 MG tablet 2 daily for 3 days (Patient not taking: Reported on 01/22/2015)  . [DISCONTINUED] predniSONE (DELTASONE) 10 MG tablet Take 4 for three days 3 for three days 2 for three days 1 for three days and stop (Patient not taking: Reported on 01/22/2015)

## 2015-01-31 NOTE — Interval H&P Note (Signed)
H and P reviewed .  No significant changes.  Luisa HartPatrick WrightMD

## 2015-01-31 NOTE — Discharge Instructions (Signed)
Flexible Bronchoscopy, Care After These instructions give you information on caring for yourself after your procedure. Your doctor may also give you more specific instructions. Call your doctor if you have any problems or questions after your procedure. HOME CARE  Do not eat or drink anything for 2 hours after your procedure. If you try to eat or drink before the medicine wears off, food or drink could go into your lungs. You could also burn yourself.  After 2 hours have passed and when you can cough and gag normally, you may eat soft food and drink liquids slowly.  The day after the test, you may eat your normal diet.  You may do your normal activities.  Keep all doctor visits. GET HELP RIGHT AWAY IF:  You get more and more short of breath.  You get light-headed.  You feel like you are going to pass out (faint).  You have chest pain.  You have new problems that worry you.  You cough up more than a little blood.  You cough up more blood than before. MAKE SURE YOU:  Understand these instructions.  Will watch your condition.  Will get help right away if you are not doing well or get worse. Document Released: 07/18/2009 Document Revised: 09/25/2013 Document Reviewed: 05/25/2013 [facility] Patient Information 2015 [place], [place]. This information is not intended to replace advice given to you by your health care provider. Make sure you discuss any questions you have with your health care provider.  Do not eat or drink until after 0930 today 01/31/15

## 2015-02-02 LAB — CULTURE, BAL-QUANTITATIVE: SPECIAL REQUESTS: NORMAL

## 2015-02-02 LAB — CULTURE, BAL-QUANTITATIVE W GRAM STAIN: Colony Count: >=100000

## 2015-02-03 ENCOUNTER — Encounter (HOSPITAL_COMMUNITY): Payer: Self-pay | Admitting: Critical Care Medicine

## 2015-02-04 ENCOUNTER — Telehealth: Payer: Self-pay | Admitting: Critical Care Medicine

## 2015-02-04 ENCOUNTER — Other Ambulatory Visit: Payer: Self-pay | Admitting: Critical Care Medicine

## 2015-02-04 MED ORDER — MOXIFLOXACIN HCL 400 MG PO TABS: 400.0000 mg | ORAL_TABLET | Freq: Every day | ORAL | Status: DC

## 2015-02-04 NOTE — Telephone Encounter (Signed)
Pt had results given and needs ABX avelox given

## 2015-02-27 LAB — FUNGUS CULTURE W SMEAR
FUNGAL SMEAR: NONE SEEN
Special Requests: NORMAL

## 2015-03-16 LAB — AFB CULTURE WITH SMEAR (NOT AT ARMC)
ACID FAST SMEAR: NONE SEEN
Special Requests: NORMAL

## 2015-03-17 LAB — LEGIONELLA CULTURE: Special Requests: NORMAL

## 2015-09-22 ENCOUNTER — Ambulatory Visit: Payer: 59 | Admitting: Pulmonary Disease

## 2015-10-17 ENCOUNTER — Encounter: Payer: Self-pay | Admitting: Internal Medicine

## 2015-10-17 ENCOUNTER — Other Ambulatory Visit (INDEPENDENT_AMBULATORY_CARE_PROVIDER_SITE_OTHER): Payer: 59

## 2015-10-17 ENCOUNTER — Ambulatory Visit (INDEPENDENT_AMBULATORY_CARE_PROVIDER_SITE_OTHER)
Admission: RE | Admit: 2015-10-17 | Discharge: 2015-10-17 | Disposition: A | Discharge status: Home / Self Care | Payer: 59 | Source: Ambulatory Visit | Attending: Internal Medicine | Admitting: Internal Medicine

## 2015-10-17 ENCOUNTER — Ambulatory Visit (INDEPENDENT_AMBULATORY_CARE_PROVIDER_SITE_OTHER): Payer: 59 | Admitting: Internal Medicine

## 2015-10-17 ENCOUNTER — Other Ambulatory Visit: Payer: Self-pay | Admitting: Internal Medicine

## 2015-10-17 VITALS — BP 122/78 | HR 89 | Ht 67.0 in | Wt 218.0 lb

## 2015-10-17 DIAGNOSIS — R0600 Dyspnea, unspecified: Secondary | ICD-10-CM

## 2015-10-17 DIAGNOSIS — J329 Chronic sinusitis, unspecified: Secondary | ICD-10-CM | POA: Diagnosis not present

## 2015-10-17 DIAGNOSIS — R0609 Other forms of dyspnea: Secondary | ICD-10-CM | POA: Insufficient documentation

## 2015-10-17 DIAGNOSIS — J471 Bronchiectasis with (acute) exacerbation: Secondary | ICD-10-CM

## 2015-10-17 LAB — BASIC METABOLIC PANEL
BUN: 20 mg/dL (ref 6–23)
CHLORIDE: 99 meq/L (ref 96–112)
CO2: 31 mEq/L (ref 19–32)
Calcium: 10 mg/dL (ref 8.4–10.5)
Creatinine, Ser: 1.16 mg/dL (ref 0.40–1.50)
GFR: 67.48 mL/min (ref >60.00)
Glucose, Bld: 80 mg/dL (ref 70–99)
POTASSIUM: 3.7 meq/L (ref 3.5–5.1)
Sodium: 140 mEq/L (ref 135–145)

## 2015-10-17 LAB — CBC WITH DIFFERENTIAL/PLATELET
Basophils Absolute: 0 10*3/uL (ref 0.0–0.1)
Basophils Relative: 0.4 % (ref 0.0–3.0)
EOS PCT: 1.6 % (ref 0.0–5.0)
Eosinophils Absolute: 0.1 10*3/uL (ref 0.0–0.7)
HCT: 51.5 % (ref 39.0–52.0)
Hemoglobin: 17.4 g/dL — ABNORMAL HIGH (ref 13.0–17.0)
LYMPHS ABS: 2.8 10*3/uL (ref 0.7–4.0)
Lymphocytes Relative: 30.2 % (ref 12.0–46.0)
MCHC: 33.7 g/dL (ref 30.0–36.0)
MCV: 89.7 fl (ref 78.0–100.0)
MONOS PCT: 12.7 % — AB (ref 3.0–12.0)
Monocytes Absolute: 1.2 10*3/uL — ABNORMAL HIGH (ref 0.1–1.0)
NEUTROS PCT: 55.1 % (ref 43.0–77.0)
Neutro Abs: 5.1 10*3/uL (ref 1.4–7.7)
PLATELETS: 204 10*3/uL (ref 150.0–400.0)
RBC: 5.74 Mil/uL (ref 4.22–5.81)
RDW: 13.8 % (ref 11.5–15.5)
WBC: 9.2 10*3/uL (ref 4.0–10.5)

## 2015-10-17 LAB — TSH: TSH: 2.53 u[IU]/mL (ref 0.35–4.50)

## 2015-10-17 LAB — BRAIN NATRIURETIC PEPTIDE: Pro B Natriuretic peptide (BNP): 3 pg/mL (ref 0.0–100.0)

## 2015-10-17 NOTE — Progress Notes (Addendum)
Subjective:    Patient ID: Ronald Delgado, male    DOB: March 14, 1951    MRN: [id number]  Brief patient profile:  [age] yowm quit smoking 2001 with hx of chronic bronchitis, recurrent hemoptysis  But nl pfts 01/2015 previously followed by Dr Delford FieldWright   History of Present Illness  01/22/2015  Last note per  Dr Delford FieldWright  Chief Complaint  Patient presents with  . 1 month follow up    with PFTs.  SOB and cough unchanged.  Cough prod with occas small amount of bright red blood.  Occas CP.  No wheezing.    Pt still with hemoptysis.  Still couging. CT chest done: bronchiectasis LLs.  pfts done today rec FOB  Neg PFTs Nl     10/17/2015 acute extended ov/Wert transition of care  re: recurrent hemoptysis / sob   Chief Complaint  Patient presents with  . Acute Visit    Pt c/o increased SOB with or without exertion for the past 2 wks. He gets out of breath walking approx 50 ft. He states that he has also had hemoptysis on and off since last visit.   onset 89 y prior to OV  With doe or bending over. 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 No unusual exp hx or h/o childhood pna/ asthma or knowledge of premature birth.  Sleeping ok without nocturnal  or early am exacerbation  of respiratory  c/o's or need for noct saba. Also denies any obvious fluctuation of symptoms with weather or environmental changes or other aggravating or alleviating factors except as outlined above   Current Medications, Allergies, Complete Past Medical History, Past Surgical History, Family History, and Social History were [facility].  ROS  The following are not active complaints unless bolded sore  throat, dysphagia, dental problems, itching, sneezing,  nasal congestion or excess/ purulent secretions, ear ache,   fever, chills, sweats, unintended wt loss, classically pleuritic or exertional cp, hemoptysis,  orthopnea pnd or leg swelling, presyncope, palpitations, abdominal pain, anorexia, nausea, vomiting, diarrhea  or change in bowel or bladder habits, change in stools or urine, dysuria,hematuria,  rash, arthralgias, visual complaints, headache, numbness, weakness or ataxia or problems with walking or coordination,  change in mood/affect or memory.           Objective:    amb mod obese wm nad   Wt Readings from Last 3 Encounters:  10/17/15 218 lb (98.884 kg)  01/22/15 220 lb (99.791 kg)  12/18/14 219 lb (99.338 kg)    Vital signs reviewed      HEENT:  [place]/AT,  EACs-clear, TMs-wnl, NOSE-clear, THROAT-clear, no lesions, no postnasal drip or exudate noted.   NECK:  Supple w/ fair ROM; no JVD; normal carotid impulses w/o bruits; no thyromegaly or nodules palpated; no lymphadenopathy.  RESP clear, no accessory muscle use, no dullness to percussion  CARD:  RRR, no m/r/g  , no peripheral edema, pulses intact, no cyanosis or clubbing.  GI:   Soft & nt; nml bowel sounds; no organomegaly or masses detected.  Musco: Warm bil, no deformities or joint swelling noted.   Neuro: alert, no focal deficits  noted.    Skin: Warm, no lesions or rashes     I personally reviewed images and agree with radiology impression as follows:    CT Chest 12/23/14    1. Reticulonodular interstitial accentuation inferiorly in the right middle lobe which could be from atypical infectious bronchiolitis, but would not be expected to cause hemoptysis. No lung mass or specific cause for hemoptysis is identified. 2. Stable scarring at the left lung base. 3. Mild cylindrical bronchiectasis in the lower lobes, right greater than left, without significant airway thickening. 4. Mild emphysema.  CXR PA and  Lateral:   10/17/2015 :    I personally reviewed images and agree with radiology impression as follows:    1. No cause for hemoptysis is currently identified. 2. Thoracic spondylosis. 3. Mild enlargement of the cardiopericardial silhouette      Labs ordered/ reviewed:      Chemistry      Component Value Date/Time   NA 140 10/17/2015 1249   K 3.7 10/17/2015 1249   CL 99 10/17/2015 1249   CO2 31 10/17/2015 1249   BUN 20 10/17/2015 1249   CREATININE 1.16 10/17/2015 1249      Component Value Date/Time   CALCIUM 10.0 10/17/2015 1249   ALKPHOS 59 09/28/2014 1726   AST 29 09/28/2014 1726   ALT 18 09/28/2014 1726   BILITOT 1.2 09/28/2014 1726        Lab Results  Component Value Date   WBC 9.2 10/17/2015   HGB 17.4* 10/17/2015   HCT 51.5 10/17/2015   MCV 89.7 10/17/2015   PLT 204.0 10/17/2015         Lab Results  Component Value Date   TSH 2.53 10/17/2015     Lab Results  Component Value Date   PROBNP 3.0 10/17/2015             Assessment & Plan:

## 2015-10-17 NOTE — Patient Instructions (Addendum)
Please remember to go to the lab and x-ray department downstairs for your tests - we will call you with the results when they are available.  Please see patient coordinator before you leave today  to schedule sinus CT   Please schedule a follow up office visit in 6 weeks, call sooner if needed

## 2015-10-18 ENCOUNTER — Encounter: Payer: Self-pay | Admitting: Internal Medicine

## 2015-10-18 NOTE — Assessment & Plan Note (Signed)
Complicated by HBP/ Hyperlipidemia  Body mass index is 34.14    Lab Results  Component Value Date   TSH 2.53 10/17/2015     Contributing to gerd tendency/ doe/reviewed the need and the process to achieve and maintain neg calorie balance > defer f/u primary care including intermittently monitoring thyroid status

## 2015-10-18 NOTE — Assessment & Plan Note (Addendum)
PFTs 01/22/15 wnl  10/17/2015  Walked RA x 2 laps @ 185 ft each stopped due to  Sob moderate pace/ no desats   Symptoms are markedly disproportionate to objective findings and not clear this is a lung problem but pt does appear to have difficult airway management issues. DDX of  difficult airways management almost all start with A and  include Adherence, Ace Inhibitors, Acid Reflux, Active Sinus Disease, Alpha 1 Antitripsin deficiency, Anxiety masquerading as Airways dz,  ABPA,  Allergy(esp in young), Aspiration (esp in elderly), Adverse effects of meds,  Active smokers, A bunch of PE's (a small clot burden can't cause this syndrome unless there is already severe underlying pulm or vascular dz with poor reserve) plus two Bs  = Bronchiectasis and Beta blocker use..and one C= CHF  Adherence is always the initial "prime suspect" and is a multilayered concern that requires a "trust but verify" approach in every patient - starting with knowing how to use medications, especially inhalers, correctly, keeping up with refills and understanding the fundamental difference between maintenance and prns vs those medications only taken for a very short course and then stopped and not refilled.   ? Acid (or non-acid) GERD > always difficult to exclude as up to 75% of pts in some series report no assoc GI/ Heartburn symptoms> rec max (24h)  acid suppression and diet restrictions/ reviewed and instructions given in writing.   ? Anxiety/depression/ deconditioning  > usually at the bottom of this list of usual suspects but should be much higher on this pt's based on H and P and note already on psychotropics .   ? Active sinus dz > suggested by epistaxis >> chronic recurrent hemoptysis > sinus CT next   ? Bronchiectasis > doubt cause of sob s evidence of any airflow obst on pfts   ? chf > ruled out with bnp << 100   I had an extended discussion with the patient reviewing all relevant studies completed to date and   lasting 25 minutes of a 40 minute transition of care office  visit    Each maintenance medication was reviewed in detail including most importantly the difference between maintenance and prns and under what circumstances the prns are to be triggered using an action plan format that is not reflected in the computer generated alphabetically organized AVS.    Please see instructions for details which were reviewed in writing and the patient given a copy highlighting the part that I personally wrote and discussed at today's ov.

## 2015-10-18 NOTE — Assessment & Plan Note (Signed)
Await sinus Ct ? ent eval for recurrent / chronic epistaxis

## 2015-10-18 NOTE — Assessment & Plan Note (Signed)
CT chest 12/23/14  1. Reticulonodular interstitial accentuation inferiorly in the right middle lobe which could be from atypical infectious bronchiolitis, but would not be expected to cause hemoptysis. No lung mass or specific cause for hemoptysis is identified. 2. Stable scarring at the left lung base. 3. Mild cylindrical bronchiectasis in the lower lobes, right greater than left, without significant airway thickening. - FOB 01/31/15 neg per Dr Delford FieldWright   Very mild radiographically and hx is not supportive of exac (no purulent sputum, just low grade hemoptysis) and may not be the source of the hemoptysis ? From sinuses > sinus ct next step

## 2015-10-20 NOTE — Progress Notes (Signed)
Quick Note:  Spoke with pt and notified of results per Dr. Wert. Pt verbalized understanding and denied any questions.  ______ 

## 2015-10-21 ENCOUNTER — Ambulatory Visit (INDEPENDENT_AMBULATORY_CARE_PROVIDER_SITE_OTHER)
Admission: RE | Admit: 2015-10-21 | Discharge: 2015-10-21 | Disposition: A | Discharge status: Home / Self Care | Payer: 59 | Source: Ambulatory Visit | Attending: Internal Medicine | Admitting: Internal Medicine

## 2015-10-21 DIAGNOSIS — J329 Chronic sinusitis, unspecified: Secondary | ICD-10-CM | POA: Diagnosis not present

## 2015-10-21 NOTE — Progress Notes (Signed)
Quick Note:  Spoke with pt and notified of results per Dr. Wert. Pt verbalized understanding and denied any questions.  ______ 

## 2015-10-23 LAB — RESPIRATORY ALLERGY PROFILE REGION II ~~LOC~~
Allergen, Cedar tree, t12: <0.1 kU/L
Allergen, Comm Silver Birch, t9: <0.1 kU/L
Allergen, D pternoyssinus,d7: <0.1 kU/L
Allergen, Mouse Urine Protein, e78: <0.1 kU/L
Alternaria Alternata: <0.1 kU/L
Cat Dander: <0.1 kU/L
D. farinae: <0.1 kU/L
Dog Dander: <0.1 kU/L
Elm IgE: <0.1 kU/L
IgE (Immunoglobulin E), Serum: 226 kU/L — ABNORMAL HIGH (ref <115)
Oak: <0.1 kU/L
Pecan/Hickory Tree IgE: <0.1 kU/L
Penicillium Notatum: <0.1 kU/L
Rough Pigweed  IgE: <0.1 kU/L
Sheep Sorrel IgE: <0.1 kU/L
Timothy Grass: <0.1 kU/L

## 2015-12-01 ENCOUNTER — Encounter: Payer: Self-pay | Admitting: Internal Medicine

## 2015-12-01 ENCOUNTER — Ambulatory Visit (INDEPENDENT_AMBULATORY_CARE_PROVIDER_SITE_OTHER): Payer: 59 | Admitting: Internal Medicine

## 2015-12-01 VITALS — BP 122/84 | HR 91 | Ht 67.0 in | Wt 218.0 lb

## 2015-12-01 DIAGNOSIS — J471 Bronchiectasis with (acute) exacerbation: Secondary | ICD-10-CM | POA: Diagnosis not present

## 2015-12-01 MED ORDER — PREDNISONE 10 MG PO TABS
ORAL_TABLET | ORAL | Status: DC
Start: 2015-12-01 — End: 2016-03-04

## 2015-12-01 MED ORDER — PANTOPRAZOLE SODIUM 40 MG PO TBEC: 40.0000 mg | DELAYED_RELEASE_TABLET | Freq: Every day | ORAL | Status: DC

## 2015-12-01 MED ORDER — AMOXICILLIN-POT CLAVULANATE 875-125 MG PO TABS
1.0000 | ORAL_TABLET | Freq: Two times a day (BID) | ORAL | Status: DC
Start: 2015-12-01 — End: 2016-03-04

## 2015-12-01 NOTE — Assessment & Plan Note (Signed)
Complicated by HBP/ Hyperlipidemia  Body mass index is 34.14  - no change   Lab Results  Component Value Date   TSH 2.53 10/17/2015     Contributing to gerd tendency/ doe/reviewed the need and the process to achieve and maintain neg calorie balance > defer f/u primary care including intermittently monitoring thyroid status

## 2015-12-01 NOTE — Progress Notes (Signed)
Subjective:    Patient ID: Ronald Delgado, male    DOB: [birth year]    MRN: [id number]  Brief patient profile:  43 yowm quit smoking 2001 with hx of chronic bronchitis, recurrent hemoptysis  But nl pfts 01/2015 previously followed by Dr Ronald Delgado   History of Present Illness  01/22/2015  Last note per  Dr Ronald Delgado  Chief Complaint  Patient presents with  . 1 month follow up    with PFTs.  SOB and cough unchanged.  Cough prod with occas small amount of bright red blood.  Occas CP.  No wheezing.    Pt still with hemoptysis.  Still couging. CT chest done: bronchiectasis LLs.  pfts done today rec FOB  Neg PFTs Nl     10/17/2015 acute extended ov/Ronald Delgado transition of care  re: recurrent hemoptysis / sob   Chief Complaint  Patient presents with  . Acute Visit    Pt c/o increased SOB with or without exertion for the past 2 wks. He gets out of breath walking approx 50 ft. He states that he has also had hemoptysis on and off since last visit.   onset 43 y prior to OV  With doe or bending over. 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 Follow-up    Breathing has been worse over the past wk. He has had increased cough with green to yellow sputum for the past 3 days.   more cough esp hs and in am x 3 days    No obvious day to day or daytime variability or assoc chronic cough or cp or chest tightness, subjective wheeze or overt   hb symptoms. No unusual exp hx or h/o childhood pna/ asthma or knowledge of premature birth.  Sleeping ok without nocturnal  or early am exacerbation  of respiratory  c/o's or need for noct saba. Also denies any obvious fluctuation of symptoms with weather or environmental changes or other aggravating or alleviating  factors except as outlined above   Current Medications, Allergies, Complete Past Medical History, Past Surgical History, Family History, and Social History were reviewed in Ronald Delgado record.  ROS  The following are not active complaints unless bolded sore throat, dysphagia, dental problems, itching, sneezing,  nasal congestion or excess/ purulent secretions, ear ache,   fever, chills, sweats, unintended wt loss, classically pleuritic or exertional cp, hemoptysis,  orthopnea pnd or leg swelling, presyncope, palpitations, abdominal pain, anorexia, nausea, vomiting, diarrhea  or change in bowel or bladder habits, change in stools or urine, dysuria,hematuria,  rash, arthralgias, visual complaints, headache, numbness, weakness or ataxia or problems with walking or coordination,  change in mood/affect or memory.           Objective:    amb mod obese wm nad  12/01/2015         218  10/17/15 218 lb (98.884 kg)  01/22/15 220 lb (99.791 kg)  12/18/14 219 lb (99.338 kg)    Vital signs reviewed      HEENT:  Ronald Delgado/AT,  EACs-clear, TMs-wnl, NOSE-clear, THROAT-clear, no lesions, no postnasal drip or exudate noted.   NECK:  Supple w/ fair ROM; no JVD; normal carotid impulses w/o  bruits; no thyromegaly or nodules palpated; no lymphadenopathy.  RESP clear, no accessory muscle use, no dullness to percussion  CARD:  RRR, no m/r/g  , no peripheral edema, pulses intact, no cyanosis or clubbing.  GI:   Soft & nt; nml bowel sounds; no organomegaly or masses detected.  Musco: Warm bil, no deformities or joint swelling noted.   Neuro: alert, no focal deficits noted.    Skin: Warm, no lesions or rashes     I personally reviewed images and agree with radiology impression as follows:    CT Chest 12/23/14    1. Reticulonodular interstitial accentuation inferiorly in the right middle lobe which could be from atypical infectious bronchiolitis, but would not be expected to cause  hemoptysis. No lung mass or specific cause for hemoptysis is identified. 2. Stable scarring at the left lung base. 3. Mild cylindrical bronchiectasis in the lower lobes, right greater than left, without significant airway thickening. 4. Mild emphysema.        CXR PA and Lateral:   10/17/2015 :    I personally reviewed images and agree with radiology impression as follows:    1. No cause for hemoptysis is currently identified. 2. Thoracic spondylosis. 3. Mild enlargement of the cardiopericardial silhouette    Labs ordered/ reviewed:      Chemistry      Component Value Date/Time   NA 140 10/17/2015 1249   K 3.7 10/17/2015 1249   CL 99 10/17/2015 1249   CO2 31 10/17/2015 1249   BUN 20 10/17/2015 1249   CREATININE 1.16 10/17/2015 1249      Component Value Date/Time   CALCIUM 10.0 10/17/2015 1249   ALKPHOS 59 09/28/2014 1726   AST 29 09/28/2014 1726   ALT 18 09/28/2014 1726   BILITOT 1.2 09/28/2014 1726        Lab Results  Component Value Date   WBC 9.2 10/17/2015   HGB 17.4* 10/17/2015   HCT 51.5 10/17/2015   MCV 89.7 10/17/2015   PLT 204.0 10/17/2015        Lab Results  Component Value Date   TSH 2.53 10/17/2015     Lab Results  Component Value Date   PROBNP 3.0 10/17/2015                            Assessment & Plan:

## 2015-12-01 NOTE — Patient Instructions (Addendum)
Any time your mucus gets nasty take  Augmentin 875 mg take one pill twice daily  X 10 days - take at breakfast and supper with large glass of water.  It would help reduce the usual side effects (diarrhea and yeast infections) if you ate cultured yogurt at lunch.   For cough/ congestion > mucinex or mucinex dm up to 1200 mg every 12 hours  Prednisone 10 mg take  4 each am x 2 days,   2 each am x 2 days,  1 each am x 2 days and stop   Pantoprazole (protonix) 40 mg   Take  30-60 min before first meal of the day and  Change the Pepcid (famotidine)  20 mg just one @  bedtime until return to office - this is the best way to tell whether stomach acid is contributing to your problem.   GERD (REFLUX)  is an extremely common cause of respiratory symptoms just like yours , many times with no obvious heartburn at all.    It can be treated with medication, but also with lifestyle changes including elevation of the head of your bed (ideally with 6 inch  bed blocks),  Smoking cessation, avoidance of late meals, excessive alcohol, and avoid fatty foods, chocolate, peppermint, colas, red wine, and acidic juices such as orange juice.  NO MINT OR MENTHOL PRODUCTS SO NO COUGH DROPS  USE SUGARLESS CANDY INSTEAD (Jolley ranchers or Stover's or Life Savers) or even ice chips will also do - the key is to swallow to prevent all throat clearing. NO OIL BASED VITAMINS - use powdered substitutes.    Please schedule a follow up visit in 3 months but call sooner if needed

## 2015-12-01 NOTE — Assessment & Plan Note (Addendum)
CT chest 12/23/14  1. Reticulonodular interstitial accentuation inferiorly in the right middle lobe which could be from atypical infectious bronchiolitis, but would not be expected to cause hemoptysis. No lung mass or specific cause for hemoptysis is identified. 2. Stable scarring at the left lung base. 3. Mild cylindrical bronchiectasis in the lower lobes, right greater than left, without significant airway thickening. - FOB 01/31/15 neg per Dr Delford Field  - allergy profile 10/09/15 >  Eos 0.1 -  IgE 226 neg RAST  - Spirometry 12/01/2015   wnl including fef 25-75  - Augmentin x 10 d prn added 12/01/2015   Despite flare in symptoms no airflow obst at all but symptoms remain difficult to control.  DDX of  difficult airways management almost all start with A and  include Adherence, Ace Inhibitors, Acid Reflux, Active Sinus Disease, Alpha 1 Antitripsin deficiency, Anxiety masquerading as Airways dz,  ABPA,  Allergy(esp in young), Aspiration (esp in elderly), Adverse effects of meds,  Active smokers, A bunch of PE's (a small clot burden can't cause this syndrome unless there is already severe underlying pulm or vascular dz with poor reserve) plus two Bs  = Bronchiectasis and Beta blocker use..and one C= CHF  Adherence is always the initial "prime suspect" and is a multilayered concern that requires a "trust but verify" approach in every patient - starting with knowing how to use medications, especially inhalers, correctly, keeping up with refills and understanding the fundamental difference between maintenance and prns vs those medications only taken for a very short course and then stopped and not refilled.   ? Acid (or non-acid) GERD > always difficult to exclude as up to 75% of pts in some series report no assoc GI/ Heartburn symptoms> rec max (24h)  acid suppression and diet restrictions/ reviewed and instructions given in writing.   ? Active sinusitis/bronchiectasis > ok to use 10 day cycles of pred prn    ? Allergy/ inflammatory component that is not asthmatic per se >  Prednisone 10 mg take  4 each am x 2 days,   2 each am x 2 days,  1 each am x 2 days and stop   I had an extended discussion with the patient reviewing all relevant studies completed to date and  lasting 15 to 20 minutes of a 25 minute visit    Each maintenance medication was reviewed in detail including most importantly the difference between maintenance and prns and under what circumstances the prns are to be triggered using an action plan format that is not reflected in the computer generated alphabetically organized AVS.    Please see instructions for details which were reviewed in writing and the patient given a copy highlighting the part that I personally wrote and discussed at today's ov.

## 2016-02-25 ENCOUNTER — Other Ambulatory Visit: Payer: Self-pay | Admitting: Internal Medicine

## 2016-03-04 ENCOUNTER — Other Ambulatory Visit (INDEPENDENT_AMBULATORY_CARE_PROVIDER_SITE_OTHER): Payer: 59

## 2016-03-04 ENCOUNTER — Encounter: Payer: Self-pay | Admitting: Internal Medicine

## 2016-03-04 ENCOUNTER — Ambulatory Visit (INDEPENDENT_AMBULATORY_CARE_PROVIDER_SITE_OTHER): Payer: 59 | Admitting: Internal Medicine

## 2016-03-04 ENCOUNTER — Ambulatory Visit (INDEPENDENT_AMBULATORY_CARE_PROVIDER_SITE_OTHER)
Admission: RE | Admit: 2016-03-04 | Discharge: 2016-03-04 | Disposition: A | Discharge status: Home / Self Care | Payer: 59 | Source: Ambulatory Visit | Attending: Internal Medicine | Admitting: Internal Medicine

## 2016-03-04 VITALS — BP 116/80 | HR 103 | Ht 67.0 in | Wt 225.6 lb

## 2016-03-04 DIAGNOSIS — R0600 Dyspnea, unspecified: Secondary | ICD-10-CM

## 2016-03-04 LAB — CBC WITH DIFFERENTIAL/PLATELET
Basophils Absolute: 0 10*3/uL (ref 0.0–0.1)
Basophils Relative: 0.1 % (ref 0.0–3.0)
EOS ABS: 0.1 10*3/uL (ref 0.0–0.7)
EOS PCT: 1.8 % (ref 0.0–5.0)
HCT: 50.1 % (ref 39.0–52.0)
Hemoglobin: 17.1 g/dL — ABNORMAL HIGH (ref 13.0–17.0)
LYMPHS ABS: 1.8 10*3/uL (ref 0.7–4.0)
Lymphocytes Relative: 25 % (ref 12.0–46.0)
MCHC: 34.1 g/dL (ref 30.0–36.0)
MCV: 88.5 fl (ref 78.0–100.0)
MONO ABS: 0.7 10*3/uL (ref 0.1–1.0)
Monocytes Relative: 9.8 % (ref 3.0–12.0)
NEUTROS PCT: 63.3 % (ref 43.0–77.0)
Neutro Abs: 4.5 10*3/uL (ref 1.4–7.7)
Platelets: 199 10*3/uL (ref 150.0–400.0)
RBC: 5.66 Mil/uL (ref 4.22–5.81)
RDW: 14.2 % (ref 11.5–15.5)
WBC: 7.1 10*3/uL (ref 4.0–10.5)

## 2016-03-04 LAB — BASIC METABOLIC PANEL
BUN: 16 mg/dL (ref 6–23)
CALCIUM: 10.1 mg/dL (ref 8.4–10.5)
CO2: 30 meq/L (ref 19–32)
CREATININE: 1.21 mg/dL (ref 0.40–1.50)
Chloride: 99 mEq/L (ref 96–112)
GFR: 64.2 mL/min (ref >60.00)
Glucose, Bld: 111 mg/dL — ABNORMAL HIGH (ref 70–99)
Potassium: 3.8 mEq/L (ref 3.5–5.1)
SODIUM: 138 meq/L (ref 135–145)

## 2016-03-04 LAB — TSH: TSH: 1.27 u[IU]/mL (ref 0.35–4.50)

## 2016-03-04 LAB — BRAIN NATRIURETIC PEPTIDE: PRO B NATRI PEPTIDE: 6 pg/mL (ref 0.0–100.0)

## 2016-03-04 NOTE — Patient Instructions (Signed)
Please see patient coordinator before you leave today  to schedule overnight pulse oxymetry on [facility] to see if you need a formal sleep study  Please remember to go to the lab and x-ray department downstairs for your tests - we will call you with the results when they are available.  Stay on acid suppression as you are     Pace yourself with walking and try to build up to 30 minutes daily if possible  Please schedule a follow up visit in 3 months but call sooner if needed

## 2016-03-04 NOTE — Progress Notes (Signed)
Subjective:    Patient ID: Ronald Delgado, male    DOB: March 29, 1951    MRN: [id number]  Brief patient profile:  [age] yowm quit smoking 2001 @ wt 205 with hx of chronic bronchitis, recurrent hemoptysis  But nl pfts 01/2015 previously followed by Dr Delford FieldWright   History of Present Illness  01/22/2015  Last note per  Dr Delford FieldWright  Chief Complaint  Patient presents with  . 1 month follow up    with PFTs.  SOB and cough unchanged.  Cough prod with occas small amount of bright red blood.  Occas CP.  No wheezing.    Pt still with hemoptysis.  Still couging. CT chest done: bronchiectasis LLs.  pfts done today rec FOB  Neg PFTs Nl     10/17/2015 acute extended ov/Dakota Stangl transition of care  re: recurrent hemoptysis / sob   Chief Complaint  Patient presents with  . Acute Visit    Pt c/o increased SOB with or without exertion for the past 2 wks. He gets out of breath walking approx 50 ft. He states that he has also had hemoptysis on and off since last visit.   onset [age] y prior to OV  With doe or bending over. 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 Follow-up    Breathing has been worse over the past wk. He has had increased cough with green to yellow sputum for the past 3 days.   more cough esp hs and in am x 3 days  rec Any time your mucus gets nasty take  Augmentin 875 mg take one pill twice daily  X 10 days  For cough/ congestion > mucinex or mucinex dm up to 1200 mg every 12 hours Prednisone 10 mg take  4 each am x 2 days,   2 each am x 2 days,  1 each am x 2 days and stop  Pantoprazole (protonix) 40 mg   Take  30-60 min before first meal of the day and  Change the Pepcid (famotidine)  20 mg just one @  bedtime until  return to office - this is the best way to tell whether stomach acid is contributing to your problem.  GERD (REFLUX) diet    03/04/2016  f/u ov/Lakeyshia Tuckerman re: sob/ bronchiectasis/ cough is better  Chief Complaint  Patient presents with  . Follow-up    Pt states his breathing seems worse since the last visit. He states that he gets out of breath walking up 1 flight of stairs. He does fine on a flat surface at normal pace. Cough has been prod with light green sputum.   doe = MMRC1 = can walk nl pace, flat grade, can't hurry or go uphills or steps s sob   Onset gradual  But 10 days really noticeable  Some daytime drowsiness no worse than usual   No obvious patterns in day to day or daytime variability or assoc chronic cough or cp or chest tightness, subjective wheeze or overt   hb symptoms. No unusual exp hx or h/o childhood pna/ asthma or knowledge of premature birth.  Sleeping ok without nocturnal  or early am exacerbation  of respiratory  c/o's or need for noct saba. Also denies any obvious fluctuation of symptoms with weather or environmental changes or other aggravating or alleviating factors except as outlined above   Current Medications, Allergies, Complete Past Medical History, Past Surgical History, Family History, and Social History were reviewed in [employer] record.  ROS  The following are not active complaints unless bolded sore throat, dysphagia, dental problems, itching, sneezing,  nasal congestion or excess/ purulent secretions, ear ache,   fever, chills, sweats, unintended wt loss, classically pleuritic or exertional cp, hemoptysis,  orthopnea pnd or leg swelling, presyncope, palpitations, abdominal pain, anorexia, nausea, vomiting, diarrhea  or change in bowel or bladder habits, change in stools or urine, dysuria,hematuria,  rash, arthralgias, visual complaints, headache, numbness, weakness or ataxia or problems with walking or coordination,  change in mood/affect  or memory.           Objective:    amb mod obese wm nad  12/01/2015         218 >  03/04/2016   226   10/17/15 218 lb (98.884 kg)  01/22/15 220 lb (99.791 kg)  12/18/14 219 lb (99.338 kg)    Vital signs reviewed      HEENT:  [place]/AT,  EACs-clear, TMs-wnl, NOSE-clear, THROAT-clear, no lesions, no postnasal drip or exudate noted.  Modified Mallampati Score = 1   NECK:  Supple w/ fair ROM; no JVD; normal carotid impulses w/o bruits; no thyromegaly or nodules palpated; no lymphadenopathy.  RESP clear, no accessory muscle use, no dullness to percussion  CARD:  RRR, no m/r/g  , no peripheral edema, pulses intact, no cyanosis or clubbing.  GI:   Soft & nt; nml bowel sounds; no organomegaly or masses detected.  Musco: Warm bil, no deformities or joint swelling noted.   Neuro: alert, no focal deficits noted.    Skin: Warm, no lesions or rashes      CXR PA and Lateral:   03/04/2016 :    I personally reviewed images and agree with radiology impression as follows:    No evidence of pneumonia nor CHF. Stable coarse lung markings especially in the right middle lobe likely reflects the patient's smoking history and the history of bronchiectasis.      Labs ordered/ reviewed:      Chemistry      Component Value Date/Time   NA 138 03/04/2016 0940   K 3.8 03/04/2016 0940   CL 99 03/04/2016 0940   CO2 30 03/04/2016 0940   BUN 16 03/04/2016 0940   CREATININE 1.21 03/04/2016 0940      Component Value Date/Time   CALCIUM 10.1 03/04/2016 0940   ALKPHOS 59 09/28/2014 1726   AST 29 09/28/2014 1726   ALT 18 09/28/2014 1726   BILITOT 1.2 09/28/2014 1726        Lab Results  Component Value Date   WBC 7.1 03/04/2016   HGB 17.1* 03/04/2016   HCT 50.1 03/04/2016   MCV 88.5 03/04/2016   PLT 199.0 03/04/2016        Lab Results  Component Value Date   TSH 1.27 03/04/2016     Lab Results  Component Value Date   PROBNP 6.0 03/04/2016                           Assessment & Plan:

## 2016-03-05 NOTE — Progress Notes (Signed)
Quick Note:  Spoke with pt and notified of results per Dr. Wert. Pt verbalized understanding and denied any questions.  ______ 

## 2016-03-08 NOTE — Assessment & Plan Note (Addendum)
PFTs 01/22/15 wnl  10/17/2015  Walked RA x 2 laps @ 185 ft each stopped due to  Sob moderate pace/ no desats   Symptoms are markedly disproportionate to objective findings and not clear this is a lung problem but pt does appear to have difficult airway management issues. DDX of  difficult airways management almost all start with A and  include Adherence, Ace Inhibitors, Acid Reflux, Active Sinus Disease, Alpha 1 Antitripsin deficiency, Anxiety masquerading as Airways dz,  ABPA,  Allergy(esp in young), Aspiration (esp in elderly), Adverse effects of meds,  Active smokers, A bunch of PE's (a small clot burden can't cause this syndrome unless there is already severe underlying pulm or vascular dz with poor reserve) plus two Bs  = Bronchiectasis and Beta blocker use..and one C= CHF  Adherence is always the initial "prime suspect" and is a multilayered concern that requires a "trust but verify" approach in every patient - starting with knowing how to use medications, especially inhalers, correctly, keeping up with refills and understanding the fundamental difference between maintenance and prns vs those medications only taken for a very short course and then stopped and not refilled.   ? Acid (or non-acid) GERD > always difficult to exclude as up to 75% of pts in some series report no assoc GI/ Heartburn symptoms> rec continue max (24h)  acid suppression and diet restrictions/ reviewed      ? Anxiety/depression/deconditioning > usually dzx of exclusion but higher here based on H and P and note already on psychotropics  Bronchiectasis present but isolated and not severe enough to explain chronic sob  ? chf > excluded clinically and supported by such a low bnp   Will check overnight pulse ox to see if formal sleep eval needed/ encourage wt loss, no change in rx for now

## 2016-03-08 NOTE — Assessment & Plan Note (Signed)
Complicated by HBP/ Hyperlipidemia  Body mass index is 35.33    Lab Results  Component Value Date   TSH 1.27 03/04/2016     Contributing to gerd tendency/ doe/reviewed the need and the process to achieve and maintain neg calorie balance > defer f/u primary care including intermittently monitoring thyroid status

## 2016-03-23 ENCOUNTER — Other Ambulatory Visit: Payer: Self-pay | Admitting: Internal Medicine

## 2016-05-27 ENCOUNTER — Other Ambulatory Visit: Payer: Self-pay | Admitting: Neurosurgery

## 2016-05-27 DIAGNOSIS — M5412 Radiculopathy, cervical region: Secondary | ICD-10-CM

## 2016-06-08 ENCOUNTER — Ambulatory Visit: Payer: 59 | Admitting: Internal Medicine

## 2016-06-09 ENCOUNTER — Other Ambulatory Visit: Payer: 59

## 2016-06-19 ENCOUNTER — Other Ambulatory Visit: Payer: Self-pay | Admitting: Internal Medicine

## 2016-09-20 ENCOUNTER — Other Ambulatory Visit: Payer: Self-pay | Admitting: Internal Medicine

## 2019-10-11 ENCOUNTER — Other Ambulatory Visit: Payer: Self-pay

## 2019-10-11 ENCOUNTER — Ambulatory Visit: Payer: 59 | Admitting: Internal Medicine

## 2019-10-11 ENCOUNTER — Ambulatory Visit (INDEPENDENT_AMBULATORY_CARE_PROVIDER_SITE_OTHER): Payer: 59

## 2019-10-11 ENCOUNTER — Encounter: Payer: Self-pay | Admitting: Internal Medicine

## 2019-10-11 DIAGNOSIS — R0600 Dyspnea, unspecified: Secondary | ICD-10-CM | POA: Diagnosis not present

## 2019-10-11 DIAGNOSIS — T502X5A Adverse effect of carbonic-anhydrase inhibitors, benzothiadiazides and other diuretics, initial encounter: Secondary | ICD-10-CM

## 2019-10-11 DIAGNOSIS — J471 Bronchiectasis with (acute) exacerbation: Secondary | ICD-10-CM

## 2019-10-11 DIAGNOSIS — R0609 Other forms of dyspnea: Secondary | ICD-10-CM

## 2019-10-11 DIAGNOSIS — E876 Hypokalemia: Secondary | ICD-10-CM

## 2019-10-11 LAB — BASIC METABOLIC PANEL
BUN: 20 mg/dL (ref 6–23)
CO2: 28 mEq/L (ref 19–32)
Calcium: 10.1 mg/dL (ref 8.4–10.5)
Chloride: 100 mEq/L (ref 96–112)
Creatinine, Ser: 0.94 mg/dL (ref 0.40–1.50)
GFR: 79.93 mL/min (ref >60.00)
Glucose, Bld: 103 mg/dL — ABNORMAL HIGH (ref 70–99)
Potassium: 3.2 mEq/L — ABNORMAL LOW (ref 3.5–5.1)
Sodium: 138 mEq/L (ref 135–145)

## 2019-10-11 LAB — CBC WITH DIFFERENTIAL/PLATELET
Basophils Absolute: 0 10*3/uL (ref 0.0–0.1)
Basophils Relative: 0.3 % (ref 0.0–3.0)
Eosinophils Absolute: 0.1 10*3/uL (ref 0.0–0.7)
Eosinophils Relative: 1.6 % (ref 0.0–5.0)
HCT: 48.8 % (ref 39.0–52.0)
Hemoglobin: 16.5 g/dL (ref 13.0–17.0)
Lymphocytes Relative: 29.3 % (ref 12.0–46.0)
Lymphs Abs: 2.2 10*3/uL (ref 0.7–4.0)
MCHC: 33.8 g/dL (ref 30.0–36.0)
MCV: 90.6 fl (ref 78.0–100.0)
Monocytes Absolute: 0.8 10*3/uL (ref 0.1–1.0)
Monocytes Relative: 10.9 % (ref 3.0–12.0)
Neutro Abs: 4.3 10*3/uL (ref 1.4–7.7)
Neutrophils Relative %: 57.9 % (ref 43.0–77.0)
Platelets: 176 10*3/uL (ref 150.0–400.0)
RBC: 5.39 Mil/uL (ref 4.22–5.81)
RDW: 13.7 % (ref 11.5–15.5)
WBC: 7.5 10*3/uL (ref 4.0–10.5)

## 2019-10-11 LAB — BRAIN NATRIURETIC PEPTIDE: Pro B Natriuretic peptide (BNP): 6 pg/mL (ref 0.0–100.0)

## 2019-10-11 LAB — TSH: TSH: 1.41 u[IU]/mL (ref 0.35–4.50)

## 2019-10-11 MED ORDER — PANTOPRAZOLE SODIUM 40 MG PO TBEC: DELAYED_RELEASE_TABLET | ORAL | 2 refills | Status: DC

## 2019-10-11 NOTE — Progress Notes (Signed)
Subjective:    Patient ID: Ronald Delgado, male    DOB: [birth year]    MRN: [id number]  Brief patient profile:  89 yowm quit smoking 2001 @ wt 89 with hx of chronic bronchitis, recurrent hemoptysis  But nl pfts 01/2015 previously followed by Dr Joya Gaskins   History of Present Illness  01/22/2015  Last note per  Dr Joya Gaskins  Chief Complaint  Patient presents with  . 1 month follow up    with PFTs.  SOB and cough unchanged.  Cough prod with occas small amount of bright red blood.  Occas CP.  No wheezing.    Pt still with hemoptysis.  Still couging. CT chest done: bronchiectasis LLs.  pfts done today rec FOB  Neg PFTs Nl     10/17/2015 acute extended ov/Ronald Delgado transition of care  re: recurrent hemoptysis / sob   Chief Complaint  Patient presents with  . Acute Visit    Pt c/o increased SOB with or without exertion for the past 2 wks. He gets out of breath walking approx 50 ft. He states that he has also had hemoptysis on and off since last visit.   onset 51 y prior to OV  With doe or bending over. 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 Follow-up    Breathing has been worse over the past wk. He has had increased cough with green to yellow sputum for the past 3 days.   more cough esp hs and in am x 3 days  rec Any time your mucus gets nasty take  Augmentin 875 mg take one pill twice daily  X 10 days  For cough/ congestion > mucinex or mucinex dm up to 1200 mg every 12 hours Prednisone 10 mg take  4 each am x 2 days,   2 each am x 2 days,  1 each am x 2 days and stop  Pantoprazole (protonix) 40 mg   Take  30-60 min before first meal of the day and  Change the Pepcid (famotidine)  20 mg just one @  bedtime until  return to office - this is the best way to tell whether stomach acid is contributing to your problem.  GERD (REFLUX) diet    03/04/2016  f/u ov/Ronald Delgado re: sob/ bronchiectasis/ cough is better  Chief Complaint  Patient presents with  . Follow-up    Pt states his breathing seems worse since the last visit. He states that he gets out of breath walking up 1 flight of stairs. He does fine on a flat surface at normal pace. Cough has been prod with light green sputum.   doe = MMRC1 = can walk nl pace, flat grade, can't hurry or go uphills or steps s sob   Onset gradual  But 10 days really noticeable  Some daytime drowsiness no worse than usual rec Please see patient coordinator before you leave today  to schedule overnight pulse oxymetry on Room Air to see if you need a formal sleep study Please remember to go to the lab and x-ray department downstairs for your tests - we will call you with the results when they are available. Stay on acid  suppression as you are  Pace yourself with walking and try to build up to 30 minutes daily if possible   10/11/2019   Ronald Delgado/ initial eval for worse sop x 6 months esp since around 1st Nov / cpx nov nl labs not avail Deboraha Sprang did them all") Chief Complaint  Patient presents with  . Pulmonary Consult    Self referral for increased SOB and cough x 6 months.   Dyspnea:  One flight of steps,  Has to walk at slower pace still doing 1.5 miles every day sometimes has to stop x last 2 months prior to OV   Cough: worse than usual since onset of breathing / slt yellowish worse p supper assoc with dysphagia (points to SS notch)  Sleeping: 2 pillows / bed flat - worse cough and sob flat  SABA use: none  02: none    No obvious day to day or daytime variability or assoc  mucus plugs or hemoptysis or cp or chest tightness, subjective wheeze or overt sinus or hb symptoms.     Also denies any obvious fluctuation of symptoms with weather or environmental changes or other  aggravating or alleviating factors except as outlined above   No unusual exposure hx or h/o childhood pna/ asthma or knowledge of premature birth.  Current Allergies, Complete Past Medical History, Past Surgical History, Family History, and Social History were reviewed in [employer] record.  ROS  The following are not active complaints unless bolded Hoarseness, sore throat, dysphagia, dental problems, itching, sneezing,  nasal congestion or discharge of excess mucus or purulent secretions, ear ache,   fever, chills, sweats, unintended wt loss or wt gain, classically pleuritic or exertional cp,  orthopnea pnd or arm/hand swelling  or leg swelling, presyncope, palpitations, abdominal pain, anorexia, nausea, vomiting, diarrhea  or change in bowel habits or change in bladder habits, change in stools or change in urine, dysuria, hematuria,  rash, arthralgias, visual complaints, headache, numbness, weakness or ataxia or problems with walking or coordination,  change in mood or  memory.        Current Meds  Medication Sig  . atorvastatin (LIPITOR) 20 MG tablet Take 20 mg by mouth daily.   . carbidopa-levodopa (SINEMET IR) 25-100 MG per tablet Take 1 tablet by mouth 4 (four) times daily.  . famotidine (PEPCID) 20 MG tablet Take 20 mg by mouth 2 (two) times daily.    . hydrochlorothiazide (HYDRODIURIL) 25 MG tablet Take 25 mg by mouth daily.   Marland Kitchen HYDROcodone-acetaminophen (NORCO/VICODIN) 5-325 MG per tablet Take 1-2 tablets by mouth every 4 (four) hours as needed for moderate pain or severe pain.  . nortriptyline (PAMELOR) 50 MG capsule Take 50 mg by mouth at bedtime.   . pregabalin (LYRICA) 75 MG capsule Take 75 mg by mouth 2 (two) times daily.           Objective:   Pleasant amb wm nad   10/11/2019           223 12/01/2015         218 >  03/04/2016   226   10/17/15 218 lb (98.884 kg)  01/22/15 220 lb (99.791 kg)  12/18/14 219 lb (99.338 kg)       Vital signs reviewed -  Note on arrival 02 sats  98% on RA   HEENT : pt wearing mask not removed for exam due to covid -19 concerns.    NECK :  without JVD/Nodes/TM/ nl carotid upstrokes bilaterally  LUNGS: no acc muscle use,  Nl contour chest which is clear to A and P bilaterally without cough on insp or exp maneuvers   CV:  RRR  no s3 or murmur or increase in P2, and no edema   ABD: mod obese soft and nontender with nl inspiratory excursion in the supine position. No bruits or organomegaly appreciated, bowel sounds nl  MS:  Nl gait/ ext warm without deformities, calf tenderness, cyanosis or clubbing No obvious joint restrictions   SKIN: warm and dry without lesions    NEURO:  alert, approp, nl sensorium with  no motor or cerebellar deficits apparent.               CXR PA and Lateral:   10/11/2019 :    I personally reviewed images and agree with radiology impression as follows:   Stable exam.  No active cardiopulmonary disease.  Labs ordered/ reviewed:      Chemistry      Component Value Date/Time   NA 138 10/11/2019 1208   K 3.2 (L) 10/11/2019 1208   CL 100 10/11/2019 1208   CO2 28 10/11/2019 1208   BUN 20 10/11/2019 1208   CREATININE 0.94 10/11/2019 1208      Component Value Date/Time   CALCIUM 10.1 10/11/2019 1208   ALKPHOS 59 09/28/2014 1726   AST 29 09/28/2014 1726   ALT 18 09/28/2014 1726   BILITOT 1.2 09/28/2014 1726        Lab Results  Component Value Date   WBC 7.5 10/11/2019   HGB 16.5 10/11/2019   HCT 48.8 10/11/2019   MCV 90.6 10/11/2019   PLT 176.0 10/11/2019       E0S                                                                0.1                                    10/11/2019   Lab Results  Component Value Date   DDIMER <0.19 10/11/2019      Lab Results  Component Value Date   TSH 1.41 10/11/2019     Lab Results  Component Value Date   PROBNP 6.0 10/11/2019             Labs ordered 10/11/2019  :  allergy profile   alpha one AT phenotype                  Assessment & Plan:

## 2019-10-11 NOTE — Patient Instructions (Addendum)
Stop pepcid (famotidine)  Start protonix (pantoprazole) 40 mg Take 30- 60 min before your first and last meals of the day    GERD (REFLUX)  is an extremely common cause of respiratory symptoms just like yours , many times with no obvious heartburn at all.    It can be treated with medication, but also with lifestyle changes including elevation of the head of your bed (ideally with 6 -8inch blocks under the headboard of your bed),  Smoking cessation, avoidance of late meals, excessive alcohol, and avoid fatty foods, chocolate, peppermint, colas, red wine, and acidic juices such as orange juice.  NO MINT OR MENTHOL PRODUCTS SO NO COUGH DROPS  USE SUGARLESS CANDY INSTEAD (Jolley ranchers or Stover's or Life Savers) or even ice chips will also do - the key is to swallow to prevent all throat clearing. NO OIL BASED VITAMINS - use powdered substitutes.  Avoid fish oil when coughing.    Please remember to go to the lab and x-ray department   for your tests - we will call you with the results when they are available.   Please schedule a follow up office visit in 4 weeks, sooner if needed

## 2019-10-12 ENCOUNTER — Encounter: Payer: Self-pay | Admitting: Internal Medicine

## 2019-10-12 DIAGNOSIS — E876 Hypokalemia: Secondary | ICD-10-CM | POA: Insufficient documentation

## 2019-10-12 NOTE — Assessment & Plan Note (Addendum)
Onset around 2016 PFTs 01/22/15 wnl  10/17/2015  Walked RA x 2 laps @ 185 ft each stopped due to  Sob moderate pace/ no desats  - 10/11/2019   Walked RA x two laps =  approx 517ft @ fast pace - stopped due to end of study with min sob with sats of 93%  at the end of the study.  Symptoms are   disproportionate to objective findings and not clear to what extent this is actually a pulmonary  problem but pt does appear to have difficult to sort out respiratory symptoms of unknown origin for which  DDX  = almost all start with A and  include Adherence, Ace Inhibitors, Acid Reflux, Active Sinus Disease, Alpha 1 Antitripsin deficiency, Anxiety masquerading as Airways dz,  ABPA,  Allergy(esp in young), Aspiration (esp in elderly), Adverse effects of meds,  Active smoking or Vaping, A bunch of PE's/clot burden (a few small clots can't cause this syndrome unless there is already severe underlying pulm or vascular dz with poor reserve),  Anemia or thyroid disorder, plus two Bs  = Bronchiectasis and Beta blocker use..and one C= CHF   Adherence is always the initial "prime suspect" and is a multilayered concern that requires a "trust but verify" approach in every patient - starting with knowing how to use medications, especially inhalers, correctly, keeping up with refills and understanding the fundamental difference between maintenance and prns vs those medications only taken for a very short course and then stopped and not refilled.  - return with all meds in hand using a trust but verify approach to confirm accurate Medication  Reconciliation The principal here is that until we are certain that the  patients are doing what we've asked, it makes no sense to ask them to do more.   ? Acid (or non-acid) GERD > always difficult to exclude as up to 75% of pts in some series report no assoc  Heartburn symptoms but he does have mild upper dysphagia pattern and assoc bronchiectasis > rec max (24h)  acid suppression and diet  restrictions/ reviewed and instructions given in writing.   ? Allergy / asthma > no evidence to support/ check IgE for ABA also to be complete since has bronchiectasis    ? Anemia/ thyroid dz  > ruled out  ? Alpha one AT def > check phenotype pending   ? A bunch of PE's > D dimer nl - while a normal  or high normal value (seen commonly in the elderly or chronically ill)  may miss small peripheral pe, the clot burden with sob is moderately high and the d dimer  has a very high neg pred value if used in this setting.    ? Active sinus dz > can cause a sinobronchial reflex asthma-like but no evidence for this > consider ent eval if nasal symptoms consist p address gerd and maybe one course of augmentin in meantime if worsens  Bronchiectasis > nl pfts previously, nl exam so unlikely developing any obst bronchiectasis component (copd like) and given panedemic no need to do pfts now   ? chf > bnp nl

## 2019-10-12 NOTE — Assessment & Plan Note (Signed)
CT chest 12/23/14  1. Reticulonodular interstitial accentuation inferiorly in the right middle lobe which could be from atypical infectious bronchiolitis, but would not be expected to cause hemoptysis. No lung mass or specific cause for hemoptysis is identified. 2. Stable scarring at the left lung base. 3. Mild cylindrical bronchiectasis in the lower lobes, right greater than left, without significant airway thickening. - FOB 01/31/15 neg per Dr Delford Field  - allergy profile 10/09/15 >  Eos 0.1 -  IgE 226 neg RAST  - Spirometry 12/01/2015   wnl including fef 25-75  - Augmentin x 10 d prn added 12/01/2015   Cough worse p supper is not typical of bronchiectasis and more c/w gerd so rec rx this first and then regroup in 4 weeks          Each maintenance medication was reviewed in detail including emphasizing most importantly the difference between maintenance and prns and under what circumstances the prns are to be triggered using an action plan format that is not reflected in the computer generated alphabetically organized AVS which I have not found useful in most complex patients, especially with respiratory illnesses  Total time for H and P, chart review, counseling,  directly observing portions of ambulatory 02 saturation study/  and generating AVS / charting =  40 min new pt as not seen in over 3 y

## 2019-10-12 NOTE — Assessment & Plan Note (Signed)
K 3.2 10/11/2019 on hydrodiuril > rx 20 meq kcl daily whenever taking hydrodiuril

## 2019-10-15 LAB — D-DIMER, QUANTITATIVE (NOT AT ARMC): D-Dimer, Quant: <0.19 mcg/mL FEU (ref <0.50)

## 2019-10-15 LAB — ALPHA-1 ANTITRYPSIN PHENOTYPE: A-1 Antitrypsin, Ser: 120 mg/dL (ref 83–199)

## 2019-10-15 LAB — IGE: IgE (Immunoglobulin E), Serum: 183 kU/L — ABNORMAL HIGH (ref <=114)

## 2019-10-15 NOTE — Progress Notes (Signed)
LMTCB

## 2019-10-15 NOTE — Progress Notes (Signed)
Dr Sherene Sires- the pt takes HCTZ 25 mg daily- does he need the K 20 meq daily in this case?

## 2019-10-18 ENCOUNTER — Other Ambulatory Visit: Payer: Self-pay | Admitting: Internal Medicine

## 2019-10-18 MED ORDER — POTASSIUM CHLORIDE CRYS ER 20 MEQ PO TBCR: 20.0000 meq | EXTENDED_RELEASE_TABLET | Freq: Every day | ORAL | 0 refills | Status: DC

## 2019-10-18 NOTE — Progress Notes (Signed)
Spoke with pt and notified of results per Dr. Wert. Pt verbalized understanding and denied any questions. 

## 2019-11-08 ENCOUNTER — Ambulatory Visit: Payer: 59 | Admitting: Internal Medicine

## 2019-11-17 ENCOUNTER — Other Ambulatory Visit: Payer: Self-pay | Admitting: Internal Medicine

## 2019-12-17 ENCOUNTER — Other Ambulatory Visit: Payer: Self-pay | Admitting: Internal Medicine

## 2020-01-07 ENCOUNTER — Other Ambulatory Visit: Payer: Self-pay | Admitting: Internal Medicine

## 2020-05-24 ENCOUNTER — Encounter (HOSPITAL_COMMUNITY): Payer: Self-pay

## 2020-05-24 ENCOUNTER — Emergency Department (HOSPITAL_COMMUNITY): Payer: 59

## 2020-05-24 ENCOUNTER — Emergency Department (HOSPITAL_COMMUNITY)
Admission: EM | Admit: 2020-05-24 | Discharge: 2020-05-25 | Disposition: A | Discharge status: Home / Self Care | Payer: 59 | Attending: Emergency Medicine | Admitting: Emergency Medicine

## 2020-05-24 ENCOUNTER — Other Ambulatory Visit: Payer: Self-pay

## 2020-05-24 DIAGNOSIS — Z87891 Personal history of nicotine dependence: Secondary | ICD-10-CM | POA: Insufficient documentation

## 2020-05-24 DIAGNOSIS — G20 Parkinson's disease: Secondary | ICD-10-CM | POA: Diagnosis not present

## 2020-05-24 DIAGNOSIS — R519 Headache, unspecified: Secondary | ICD-10-CM | POA: Diagnosis not present

## 2020-05-24 DIAGNOSIS — H532 Diplopia: Secondary | ICD-10-CM | POA: Insufficient documentation

## 2020-05-24 DIAGNOSIS — Z79899 Other long term (current) drug therapy: Secondary | ICD-10-CM | POA: Insufficient documentation

## 2020-05-24 MED ORDER — DIAZEPAM 5 MG PO TABS
5.0000 mg | ORAL_TABLET | Freq: Once | ORAL | Status: AC | PRN
  Administered 2020-05-24: 5 mg via ORAL
  Filled 2020-05-24: qty 1

## 2020-05-24 NOTE — ED Provider Notes (Signed)
[facility]-EMERGENCY DEPT Provider Note   CSN: [id number] Arrival date & time: 05/24/20  1947     History Chief Complaint  Patient presents with  . Headache  . Diplopia    Ronald Delgado is a 69 y.o. male.  69 yo M with a chief complaints of a headache and double vision.  Going on for about 48 hours now.  Nontraumatic right-sided headache.  Feels like a boring pain above the right eye.  Denies one-sided numbness or weakness denies difficulty speech or swallowing.  Feels that his double vision is side-by-side.  Improved with closure of either eye.  Denies neck pain.  Has chronic headaches but feels that this is different.  The history is provided by the patient.  Headache Pain location:  Frontal Quality:  Sharp Radiates to:  Does not radiate Severity currently:  3/10 Severity at highest:  3/10 Onset quality:  Gradual Duration:  2 days Timing:  Constant Progression:  Unchanged Chronicity:  New Similar to prior headaches: no   Relieved by:  Nothing Worsened by:  Nothing Ineffective treatments:  None tried Associated symptoms: no abdominal pain, no congestion, no diarrhea, no fever, no myalgias and no vomiting        Past Medical History:  Diagnosis Date  . Chronic headache   . Cough   . 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 Facial pain    HA syndrome  . Fibromyalgia   . Former smoker   . GERD (gastroesophageal reflux disease)   . Hyperlipidemia   . Insomnia   . 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 Multiple cranial nerve palsies    HX of  . Parkinsonism (HCC) 2013   tremors  [name]  . Parkinsonism (HCC)   . Vitamin D deficiency     Patient Active Problem List   Diagnosis Date Noted  . Diuretic-induced hypokalemia 10/12/2019  . Morbid obesity (HCC) 10/18/2015  . DOE (dyspnea on exertion) 10/17/2015  . Sinusitis, chronic 10/17/2015  . Bronchiectasis with acute exacerbation (HCC)   . Parkinsonism (HCC)   . GERD (gastroesophageal reflux  disease)   . Hyperlipidemia   . Chronic headache   . Has a tremor 01/19/2012  . Hemoptysis 02/18/2011    Past Surgical History:  Procedure Laterality Date  . BACK SURGERY    . NECK SURGERY    . VIDEO BRONCHOSCOPY Bilateral 01/31/2015   Procedure: VIDEO BRONCHOSCOPY WITHOUT FLUORO;  Surgeon: Storm Frisk, MD;  Location: [facility];  Service: Endoscopy;  Laterality: Bilateral;       Family History  Problem Relation Age of Onset  . Heart disease Mother   . Breast cancer Mother   . Osteoporosis Mother   . Diabetes Mother   . Hypertension Mother   . Hypertension Father   . Hypercholesterolemia Father     Social History   Tobacco Use  . Smoking status: Former Smoker    Packs/day: 1.00    Years: 42.00    Pack years: 42.00    Types: Cigarettes    Quit date: 08/16/2000    Years since quitting: 19.7  . Smokeless tobacco: Never Used  Substance Use Topics  . Alcohol use: No  . Drug use: No    Home Medications Prior to Admission medications   Medication Sig Start Date End Date Taking? Authorizing Provider  albuterol (VENTOLIN HFA) 108 (90 Base) MCG/ACT inhaler Inhale 1-2 puffs into the lungs every 6 (six) hours as needed for wheezing or  shortness of breath.   Yes [provider]  atorvastatin (LIPITOR) 20 MG tablet Take 20 mg by mouth daily.    Yes [provider]  carbidopa-levodopa (SINEMET IR) 25-100 MG per tablet Take 1 tablet by mouth 6 (six) times daily.    Yes [provider]  hydrochlorothiazide (HYDRODIURIL) 25 MG tablet Take 25 mg by mouth daily.    Yes [provider]  nortriptyline (PAMELOR) 50 MG capsule Take 50 mg by mouth at bedtime.    Yes [provider]  pantoprazole (PROTONIX) 40 MG tablet TAKE 30- 60 MIN BEFORE YOUR FIRST AND LAST MEALS OF THE DAY Patient taking differently: Take 40 mg by mouth 2 (two) times daily before a meal. Take 30- 60 min before your first and last meals of the day 01/07/20  Yes Nyoka CowdenWert,  Michael B, MD  polyvinyl alcohol (LIQUIFILM TEARS) 1.4 % ophthalmic solution Place 1 drop into both eyes as needed for dry eyes.   Yes [provider]  pregabalin (LYRICA) 75 MG capsule Take 75 mg by mouth 2 (two) times daily.    Yes [provider]  HYDROcodone-acetaminophen (NORCO/VICODIN) 5-325 MG per tablet Take 1-2 tablets by mouth every 4 (four) hours as needed for moderate pain or severe pain. Patient not taking: Reported on 05/24/2020 09/28/14   Jaynie CrumbleKirichenko, Tatyana, PA-C  KLOR-CON M20 20 MEQ tablet TAKE 1 TABLET BY MOUTH EVERY DAY Patient not taking: Reported on 05/24/2020 12/17/19   Nyoka CowdenWert, Michael B, MD    Allergies    Ceftin [cefuroxime axetil]  Review of Systems   Review of Systems  Constitutional: Negative for chills and fever.  HENT: Negative for congestion and facial swelling.   Eyes: Negative for discharge and visual disturbance.  Respiratory: Negative for shortness of breath.   Cardiovascular: Negative for chest pain and palpitations.  Gastrointestinal: Negative for abdominal pain, diarrhea and vomiting.  Musculoskeletal: Negative for arthralgias and myalgias.  Skin: Negative for color change and rash.  Neurological: Positive for headaches. Negative for tremors and syncope.  Psychiatric/Behavioral: Negative for confusion and dysphoric mood.    Physical Exam Updated Vital Signs BP (!) 148/100 (BP Location: Left Arm)   Pulse 91   Temp 97.8 F (36.6 C) (Oral)   Resp 16   Ht 5\' 7"  (1.702 m)   Wt 100.7 kg   SpO2 98%   BMI 34.77 kg/m   Physical Exam Vitals and nursing note reviewed.  Constitutional:      Appearance: He is well-developed.  HENT:     Head: Normocephalic and atraumatic.  Eyes:     Pupils: Pupils are equal, round, and reactive to light.  Neck:     Vascular: No JVD.  Cardiovascular:     Rate and Rhythm: Normal rate and regular rhythm.     Heart sounds: No murmur heard.  No friction rub. No gallop.   Pulmonary:     Effort: No  respiratory distress.     Breath sounds: No wheezing.  Abdominal:     General: There is no distension.     Tenderness: There is no guarding or rebound.  Musculoskeletal:        General: Normal range of motion.     Cervical back: Normal range of motion and neck supple.  Skin:    Coloration: Skin is not pale.     Findings: No rash.  Neurological:     Mental Status: He is alert and oriented to person, place, and time.  Motor: Motor function is intact.     Coordination: Coordination is intact.     Comments: Slight right-sided forehead palsy.  Subjective decrease sensation to the right forehead.  Otherwise benign neurologic exam.  Psychiatric:        Behavior: Behavior normal.     ED Results / Procedures / Treatments   Labs (all labs ordered are listed, but only abnormal results are displayed) Labs Reviewed  CBC WITH DIFFERENTIAL/PLATELET  BASIC METABOLIC PANEL    EKG None  Radiology CT Head Wo Contrast  Result Date: 05/24/2020 CLINICAL DATA:  Diplopia. EXAM: CT HEAD WITHOUT CONTRAST TECHNIQUE: Contiguous axial images were obtained from the base of the skull through the vertex without intravenous contrast. COMPARISON:  None. FINDINGS: Brain: There is mild cerebral atrophy with widening of the extra-axial spaces and ventricular dilatation. There are areas of decreased attenuation within the white matter tracts of the supratentorial brain, consistent with microvascular disease changes. Vascular: No hyperdense vessel or unexpected calcification. Skull: Normal. Negative for fracture or focal lesion. Sinuses/Orbits: There is a 9 mm x 8 mm left maxillary sinus polyp versus mucous retention cyst. Other: None. IMPRESSION: 1. Generalized cerebral atrophy. 2. No acute intracranial abnormality. Electronically Signed   By: Aram Candela M.D.   On: 05/24/2020 22:56    Procedures Procedures (including critical care time)  Medications Ordered in ED Medications  diazepam (VALIUM) tablet  5 mg (5 mg Oral Given 05/24/20 2338)    ED Course  I have reviewed the triage vital signs and the nursing notes.  Pertinent labs & imaging results that were available during my care of the patient were reviewed by me and considered in my medical decision making (see chart for details).    MDM Rules/Calculators/A&P                          69 yo M with a chief complaints of headache and double vision.  Going on for about 48 hours.  Horizontal diplopia.  Constant and unchanging with the exception of closing at night.  No neck pain.  States he chronically has headaches and is declining headache medication.  Start with a CT of the head.  CT scan of the head is negative.  Discussed with the neurologist on-call, Dr. Derry Lory, recommends MRI of the brain and orbit with and without contrast as the patient had a history of brainstem encephalitis he thought this could be a presentation of optic neuritis.  This is negative he felt patient could likely be followed up as an outpatient.  The patients results and plan were reviewed and discussed.   Any x-rays performed were independently reviewed by myself.   Differential diagnosis were considered with the presenting HPI.  Medications  diazepam (VALIUM) tablet 5 mg (5 mg Oral Given 05/24/20 2338)    Vitals:   05/24/20 1952 05/24/20 2006 05/24/20 2338  BP: (!) 128/93  (!) 148/100  Pulse: (!) 101  91  Resp: 17  16  Temp: 97.8 F (36.6 C)  97.8 F (36.6 C)  TempSrc: Oral  Oral  SpO2: 93%  98%  Weight: 100.7 kg 100.7 kg   Height: 5\' 7"  (1.702 m) 5\' 7"  (1.702 m)     Final diagnoses:  Diplopia  Acute intractable headache, unspecified headache type     Final Clinical Impression(s) / ED Diagnoses Final diagnoses:  Diplopia  Acute intractable headache, unspecified headache type    Rx / DC Orders ED Discharge Orders  None       Melene Plan, DO 05/24/20 2344

## 2020-05-24 NOTE — ED Triage Notes (Signed)
Arrived POV from home. Patient reports severe headache and diplopia X2 days. Patient states he has never had a headache like thin, nor issues with double vision.

## 2020-05-25 ENCOUNTER — Inpatient Hospital Stay (HOSPITAL_COMMUNITY): Admit: 2020-05-25 | Payer: 59

## 2020-05-25 LAB — BASIC METABOLIC PANEL
Anion gap: 16 — ABNORMAL HIGH (ref 5–15)
BUN: 23 mg/dL (ref 8–23)
CO2: 22 mmol/L (ref 22–32)
Calcium: 9.7 mg/dL (ref 8.9–10.3)
Chloride: 103 mmol/L (ref 98–111)
Creatinine, Ser: 0.87 mg/dL (ref 0.61–1.24)
GFR calc Af Amer: >60 mL/min (ref >60)
GFR calc non Af Amer: >60 mL/min (ref >60)
Glucose, Bld: 115 mg/dL — ABNORMAL HIGH (ref 70–99)
Potassium: 3.4 mmol/L — ABNORMAL LOW (ref 3.5–5.1)
Sodium: 141 mmol/L (ref 135–145)

## 2020-05-25 LAB — CBC WITH DIFFERENTIAL/PLATELET
Abs Immature Granulocytes: 0.06 10*3/uL (ref 0.00–0.07)
Basophils Absolute: 0 10*3/uL (ref 0.0–0.1)
Basophils Relative: 0 %
Eosinophils Absolute: 0.2 10*3/uL (ref 0.0–0.5)
Eosinophils Relative: 2 %
HCT: 50.5 % (ref 39.0–52.0)
Hemoglobin: 17 g/dL (ref 13.0–17.0)
Immature Granulocytes: 1 %
Lymphocytes Relative: 31 %
Lymphs Abs: 2.9 10*3/uL (ref 0.7–4.0)
MCH: 30.6 pg (ref 26.0–34.0)
MCHC: 33.7 g/dL (ref 30.0–36.0)
MCV: 91 fL (ref 80.0–100.0)
Monocytes Absolute: 1 10*3/uL (ref 0.1–1.0)
Monocytes Relative: 11 %
Neutro Abs: 5.2 10*3/uL (ref 1.7–7.7)
Neutrophils Relative %: 55 %
Platelets: 177 10*3/uL (ref 150–400)
RBC: 5.55 MIL/uL (ref 4.22–5.81)
RDW: 13.7 % (ref 11.5–15.5)
WBC: 9.3 10*3/uL (ref 4.0–10.5)
nRBC: 0 % (ref 0.0–0.2)

## 2020-05-25 NOTE — ED Notes (Signed)
Patient went PoV to [facility]. Report was called to charge nurse.

## 2020-05-26 ENCOUNTER — Other Ambulatory Visit: Payer: Self-pay | Admitting: Family Medicine

## 2020-05-26 DIAGNOSIS — R519 Headache, unspecified: Secondary | ICD-10-CM

## 2020-05-26 DIAGNOSIS — H532 Diplopia: Secondary | ICD-10-CM

## 2020-05-30 ENCOUNTER — Ambulatory Visit
Admission: RE | Admit: 2020-05-30 | Discharge: 2020-05-30 | Disposition: A | Discharge status: Home / Self Care | Payer: 59 | Source: Ambulatory Visit | Attending: Family Medicine | Admitting: Family Medicine

## 2020-05-30 ENCOUNTER — Other Ambulatory Visit: Payer: Self-pay

## 2020-05-30 DIAGNOSIS — H532 Diplopia: Secondary | ICD-10-CM

## 2020-05-30 DIAGNOSIS — R519 Headache, unspecified: Secondary | ICD-10-CM

## 2020-05-30 MED ORDER — GADOBENATE DIMEGLUMINE 529 MG/ML IV SOLN
20.0000 mL | Freq: Once | INTRAVENOUS | Status: AC | PRN
  Administered 2020-05-30: 20 mL via INTRAVENOUS

## 2021-02-03 ENCOUNTER — Other Ambulatory Visit: Payer: Self-pay | Admitting: Internal Medicine

## 2021-08-19 ENCOUNTER — Other Ambulatory Visit: Payer: Self-pay | Admitting: Internal Medicine

## 2022-07-03 ENCOUNTER — Other Ambulatory Visit: Payer: Self-pay

## 2022-07-03 ENCOUNTER — Encounter (HOSPITAL_BASED_OUTPATIENT_CLINIC_OR_DEPARTMENT_OTHER): Payer: Self-pay | Admitting: *Deleted

## 2022-07-03 ENCOUNTER — Emergency Department (HOSPITAL_BASED_OUTPATIENT_CLINIC_OR_DEPARTMENT_OTHER): Payer: 59

## 2022-07-03 ENCOUNTER — Emergency Department (HOSPITAL_BASED_OUTPATIENT_CLINIC_OR_DEPARTMENT_OTHER)
Admission: EM | Admit: 2022-07-03 | Discharge: 2022-07-04 | Disposition: A | Discharge status: Home / Self Care | Payer: 59 | Attending: Emergency Medicine | Admitting: Emergency Medicine

## 2022-07-03 DIAGNOSIS — U071 COVID-19: Secondary | ICD-10-CM | POA: Insufficient documentation

## 2022-07-03 DIAGNOSIS — Z79899 Other long term (current) drug therapy: Secondary | ICD-10-CM | POA: Insufficient documentation

## 2022-07-03 DIAGNOSIS — R0602 Shortness of breath: Secondary | ICD-10-CM | POA: Diagnosis present

## 2022-07-03 LAB — BASIC METABOLIC PANEL
Anion gap: 15 (ref 5–15)
BUN: 18 mg/dL (ref 8–23)
CO2: 23 mmol/L (ref 22–32)
Calcium: 9.6 mg/dL (ref 8.9–10.3)
Chloride: 98 mmol/L (ref 98–111)
Creatinine, Ser: 1.1 mg/dL (ref 0.61–1.24)
GFR, Estimated: >60 mL/min (ref >60)
Glucose, Bld: 136 mg/dL — ABNORMAL HIGH (ref 70–99)
Potassium: 3.9 mmol/L (ref 3.5–5.1)
Sodium: 136 mmol/L (ref 135–145)

## 2022-07-03 LAB — CBC
HCT: 47.1 % (ref 39.0–52.0)
Hemoglobin: 16.2 g/dL (ref 13.0–17.0)
MCH: 29.7 pg (ref 26.0–34.0)
MCHC: 34.4 g/dL (ref 30.0–36.0)
MCV: 86.4 fL (ref 80.0–100.0)
Platelets: 142 10*3/uL — ABNORMAL LOW (ref 150–400)
RBC: 5.45 MIL/uL (ref 4.22–5.81)
RDW: 14.2 % (ref 11.5–15.5)
WBC: 7 10*3/uL (ref 4.0–10.5)
nRBC: 0 % (ref 0.0–0.2)

## 2022-07-03 LAB — TROPONIN I (HIGH SENSITIVITY): Troponin I (High Sensitivity): 2 ng/L (ref <18)

## 2022-07-03 LAB — SARS CORONAVIRUS 2 BY RT PCR: SARS Coronavirus 2 by RT PCR: POSITIVE — AB

## 2022-07-03 MED ORDER — ALBUTEROL SULFATE HFA 108 (90 BASE) MCG/ACT IN AERS: 2.0000 | INHALATION_SPRAY | RESPIRATORY_TRACT | Status: DC | PRN

## 2022-07-03 NOTE — ED Triage Notes (Signed)
Pt woke up at around 3am with some chills and sob.  Pt reports that he has had a cough for a few weeks but he has been coughing more since 3am.  Pt is able to speak in full sentences at this time.

## 2022-07-04 ENCOUNTER — Emergency Department (HOSPITAL_BASED_OUTPATIENT_CLINIC_OR_DEPARTMENT_OTHER): Payer: 59

## 2022-07-04 ENCOUNTER — Encounter (HOSPITAL_BASED_OUTPATIENT_CLINIC_OR_DEPARTMENT_OTHER): Payer: Self-pay | Admitting: Emergency Medicine

## 2022-07-04 DIAGNOSIS — U071 COVID-19: Secondary | ICD-10-CM | POA: Diagnosis not present

## 2022-07-04 LAB — TROPONIN I (HIGH SENSITIVITY): Troponin I (High Sensitivity): 3 ng/L (ref <18)

## 2022-07-04 MED ORDER — MOLNUPIRAVIR EUA 200MG CAPSULE: 4.0000 | ORAL_CAPSULE | Freq: Two times a day (BID) | ORAL | 0 refills | Status: AC

## 2022-07-04 MED ORDER — SODIUM CHLORIDE 0.9 % IV BOLUS
500.0000 mL | Freq: Once | INTRAVENOUS | Status: AC
  Administered 2022-07-04: 500 mL via INTRAVENOUS

## 2022-07-04 MED ORDER — IOHEXOL 350 MG/ML SOLN
100.0000 mL | Freq: Once | INTRAVENOUS | Status: AC | PRN
  Administered 2022-07-04: 80 mL via INTRAVENOUS

## 2022-07-04 NOTE — ED Provider Notes (Signed)
[facility] Provider Note   CSN: [id number] Arrival date & time: 07/03/22  2208     History  Chief Complaint  Patient presents with   Shortness of Breath    Ronald Delgado is a 71 y.o. male.  The history is provided by the patient.  Shortness of Breath Severity:  Moderate Onset quality:  Gradual Timing:  Constant Progression:  Unchanged Chronicity:  New Context: not activity   Relieved by:  Nothing Worsened by:  Nothing Ineffective treatments:  None tried Associated symptoms: no abdominal pain, no diaphoresis, no PND, no swollen glands and no vomiting   Risk factors: no recent alcohol use   Patient with hyperlipidemia who went to [facility] and now has congestion,  Shortness of breath and cough and body aches.       Home Medications Prior to Admission medications   Medication Sig Start Date End Date Taking? Authorizing Provider  molnupiravir EUA (LAGEVRIO) 200 mg CAPS capsule Take 4 capsules (800 mg total) by mouth 2 (two) times daily for 5 days. 07/04/22 07/09/22 Yes Devario Bucklew, MD  albuterol (VENTOLIN HFA) 108 (90 Base) MCG/ACT inhaler Inhale 1-2 puffs into the lungs every 6 (six) hours as needed for wheezing or shortness of breath.    [provider]  atorvastatin (LIPITOR) 20 MG tablet Take 20 mg by mouth daily.     [provider]  carbidopa-levodopa (SINEMET IR) 25-100 MG per tablet Take 1 tablet by mouth 6 (six) times daily.     [provider]  hydrochlorothiazide (HYDRODIURIL) 25 MG tablet Take 25 mg by mouth daily.     [provider]  HYDROcodone-acetaminophen (NORCO/VICODIN) 5-325 MG per tablet Take 1-2 tablets by mouth every 4 (four) hours as needed for moderate pain or severe pain. Patient not taking: Reported on 05/24/2020 09/28/14   Jeannett Senior, PA-C  KLOR-CON M20 20 MEQ tablet TAKE 1 TABLET BY MOUTH EVERY DAY Patient not taking: Reported on 05/24/2020 12/17/19   Tanda Rockers, MD   nortriptyline (PAMELOR) 50 MG capsule Take 50 mg by mouth at bedtime.     [provider]  pantoprazole (PROTONIX) 40 MG tablet TAKE 30- 60 MIN BEFORE YOUR FIRST AND LAST MEALS OF THE DAY Patient taking differently: Take 40 mg by mouth 2 (two) times daily before a meal. Take 30- 60 min before your first and last meals of the day 01/07/20   Tanda Rockers, MD  polyvinyl alcohol (LIQUIFILM TEARS) 1.4 % ophthalmic solution Place 1 drop into both eyes as needed for dry eyes.    [provider]  pregabalin (LYRICA) 75 MG capsule Take 75 mg by mouth 2 (two) times daily.     [provider]      Allergies    Ceftin [cefuroxime axetil]    Review of Systems   Review of Systems  Constitutional:  Negative for diaphoresis.  HENT:  Positive for congestion.   Eyes:  Negative for redness.  Respiratory:  Positive for shortness of breath.   Cardiovascular:  Negative for PND.  Gastrointestinal:  Negative for abdominal pain and vomiting.  Neurological:  Negative for facial asymmetry.  All other systems reviewed and are negative.   Physical Exam Updated Vital Signs BP (!) 139/90   Pulse (!) 116   Temp 98.6 F (37 C)   Resp 18   SpO2 96%  Physical Exam Vitals and nursing note reviewed.  Constitutional:      General: He is not in acute  distress.    Appearance: He is well-developed. He is not diaphoretic.  HENT:     Head: Normocephalic and atraumatic.     Nose: Nose normal.  Eyes:     Conjunctiva/sclera: Conjunctivae normal.     Pupils: Pupils are equal, round, and reactive to light.  Cardiovascular:     Rate and Rhythm: Regular rhythm. Tachycardia present.     Pulses: Normal pulses.     Heart sounds: Normal heart sounds.  Pulmonary:     Effort: Pulmonary effort is normal.     Breath sounds: Normal breath sounds. No wheezing or rales.  Abdominal:     General: Bowel sounds are normal.     Palpations: Abdomen is soft.     Tenderness: There is no abdominal  tenderness. There is no guarding or rebound.  Musculoskeletal:        General: Normal range of motion.     Cervical back: Normal range of motion and neck supple.  Skin:    General: Skin is warm and dry.     Capillary Refill: Capillary refill takes less than 2 seconds.  Neurological:     General: No focal deficit present.     Mental Status: He is alert and oriented to person, place, and time.     Deep Tendon Reflexes: Reflexes normal.  Psychiatric:        Mood and Affect: Mood normal.        Behavior: Behavior normal.     ED Results / Procedures / Treatments   Labs (all labs ordered are listed, but only abnormal results are displayed) Results for orders placed or performed during the hospital encounter of 07/03/22  SARS Coronavirus 2 by RT PCR (hospital order, performed in [facility] hospital lab) *cepheid single result test* Anterior Nasal Swab   Specimen: Anterior Nasal Swab  Result Value Ref Range   SARS Coronavirus 2 by RT PCR POSITIVE (A) NEGATIVE  Basic metabolic panel  Result Value Ref Range   Sodium 136 135 - 145 mmol/L   Potassium 3.9 3.5 - 5.1 mmol/L   Chloride 98 98 - 111 mmol/L   CO2 23 22 - 32 mmol/L   Glucose, Bld 136 (H) 70 - 99 mg/dL   BUN 18 8 - 23 mg/dL   Creatinine, Ser 4.09 0.61 - 1.24 mg/dL   Calcium 9.6 8.9 - 73.5 mg/dL   GFR, Estimated >32 >99 mL/min   Anion gap 15 5 - 15  CBC  Result Value Ref Range   WBC 7.0 4.0 - 10.5 K/uL   RBC 5.45 4.22 - 5.81 MIL/uL   Hemoglobin 16.2 13.0 - 17.0 g/dL   HCT 24.2 68.3 - 41.9 %   MCV 86.4 80.0 - 100.0 fL   MCH 29.7 26.0 - 34.0 pg   MCHC 34.4 30.0 - 36.0 g/dL   RDW 62.2 29.7 - 98.9 %   Platelets 142 (L) 150 - 400 K/uL   nRBC 0.0 0.0 - 0.2 %  Troponin I (High Sensitivity)  Result Value Ref Range   Troponin I (High Sensitivity) 2 <18 ng/L   No results found.   EKG Long QT Radiology No results found.  Procedures Procedures    Medications Ordered in ED Medications  iohexol (OMNIPAQUE) 350 MG/ML  injection 100 mL (has no administration in time range)  sodium chloride 0.9 % bolus 500 mL (has no administration in time range)    ED Course/ Medical Decision Making/ A&P  Medical Decision Making Shortness of breath after traveling to [facility]  Amount and/or Complexity of Data Reviewed Independent Historian: spouse    Details: See above  External Data Reviewed: notes.    Details: Previous notes reviewed  Labs: ordered.    Details: All labs reviewed:  troponin is negative 2, covid is positive.  Normal white count 7, normal hemoglobin 16.2,  platelet count low 142.  Normal sodium 136, normal potassium 4, normal creatinine Radiology: ordered and independent interpretation performed.    Details: Negative CTA for PE ECG/medicine tests: ordered and independent interpretation performed. Decision-making details documented in ED Course.  Risk Prescription drug management.     Final Clinical Impression(s) / ED Diagnoses Final diagnoses:  COVID-19   Return for intractable cough, coughing up blood, fevers > 100.4 unrelieved by medication, shortness of breath, intractable vomiting, chest pain, shortness of breath, weakness, numbness, changes in speech, facial asymmetry, abdominal pain, passing out, Inability to tolerate liquids or food, cough, altered mental status or any concerns. No signs of systemic illness or infection. The patient is nontoxic-appearing on exam and vital signs are within normal limits.  I have reviewed the triage vital signs and the nursing notes. Pertinent labs & imaging results that were available during my care of the patient were reviewed by me and considered in my medical decision making (see chart for details). After history, exam, and medical workup I feel the patient has been appropriately medically screened and is safe for discharge home. Pertinent diagnoses were discussed with the patient. Patient was given return precautions.  Rx / DC  Orders ED Discharge Orders          Ordered    molnupiravir EUA (LAGEVRIO) 200 mg CAPS capsule  2 times daily        07/04/22 0010              Carlene Bickley, MD 07/04/22 0025
